# Patient Record
Sex: Female | Born: 1970 | Hispanic: No | Smoking: Current some day smoker
Health system: Southern US, Community
[De-identification: ages and names within clinical notes are randomized; demographics above are authoritative.]

## PROBLEM LIST (undated history)

## (undated) DIAGNOSIS — I10 Essential (primary) hypertension: Secondary | ICD-10-CM

## (undated) DIAGNOSIS — F329 Major depressive disorder, single episode, unspecified: Secondary | ICD-10-CM

## (undated) DIAGNOSIS — G4730 Sleep apnea, unspecified: Secondary | ICD-10-CM

## (undated) DIAGNOSIS — F419 Anxiety disorder, unspecified: Secondary | ICD-10-CM

## (undated) DIAGNOSIS — R569 Unspecified convulsions: Secondary | ICD-10-CM

## (undated) DIAGNOSIS — G709 Myoneural disorder, unspecified: Secondary | ICD-10-CM

## (undated) DIAGNOSIS — E119 Type 2 diabetes mellitus without complications: Secondary | ICD-10-CM

## (undated) DIAGNOSIS — J45909 Unspecified asthma, uncomplicated: Secondary | ICD-10-CM

## (undated) HISTORY — PX: EYE SURGERY: SHX253

## (undated) HISTORY — DX: Essential (primary) hypertension: I10

## (undated) HISTORY — DX: Anxiety disorder, unspecified: F41.9

## (undated) HISTORY — DX: Unspecified convulsions: R56.9

## (undated) HISTORY — PX: TUBAL LIGATION: SHX77

## (undated) HISTORY — DX: Sleep apnea, unspecified: G47.30

## (undated) HISTORY — DX: Myoneural disorder, unspecified: G70.9

---

## 1999-01-15 ENCOUNTER — Emergency Department (HOSPITAL_COMMUNITY): Admission: EM | Admit: 1999-01-15 | Discharge: 1999-01-16 | Payer: Self-pay | Admitting: Emergency Medicine

## 2007-10-09 ENCOUNTER — Emergency Department (HOSPITAL_COMMUNITY): Admission: EM | Admit: 2007-10-09 | Discharge: 2007-10-09 | Payer: Self-pay | Admitting: Emergency Medicine

## 2008-03-29 ENCOUNTER — Emergency Department (HOSPITAL_COMMUNITY): Admission: EM | Admit: 2008-03-29 | Discharge: 2008-03-30 | Payer: Self-pay | Admitting: Emergency Medicine

## 2011-01-20 ENCOUNTER — Inpatient Hospital Stay (HOSPITAL_COMMUNITY)
Admission: AD | Admit: 2011-01-20 | Discharge: 2011-01-20 | Disposition: A | Discharge status: Home / Self Care | Payer: Self-pay | Source: Ambulatory Visit | Attending: Family Medicine | Admitting: Family Medicine

## 2011-01-20 ENCOUNTER — Inpatient Hospital Stay (HOSPITAL_COMMUNITY): Payer: Self-pay

## 2011-01-20 DIAGNOSIS — N946 Dysmenorrhea, unspecified: Secondary | ICD-10-CM

## 2011-01-20 LAB — URINALYSIS, ROUTINE W REFLEX MICROSCOPIC
Bilirubin Urine: NEGATIVE
Ketones, ur: NEGATIVE mg/dL
Protein, ur: NEGATIVE mg/dL
Urobilinogen, UA: 0.2 mg/dL (ref 0.0–1.0)

## 2011-01-20 LAB — CBC
HCT: 39.3 % (ref 36.0–46.0)
Hemoglobin: 12.6 g/dL (ref 12.0–15.0)
MCV: 92.5 fL (ref 78.0–100.0)
Platelets: 274 10*3/uL (ref 150–400)
RBC: 4.25 MIL/uL (ref 3.87–5.11)
WBC: 7.8 10*3/uL (ref 4.0–10.5)

## 2011-01-20 LAB — URINE MICROSCOPIC-ADD ON: WBC, UA: NONE SEEN WBC/hpf (ref <3)

## 2011-01-20 LAB — POCT PREGNANCY, URINE: Preg Test, Ur: NEGATIVE

## 2011-02-18 ENCOUNTER — Encounter: Payer: Self-pay | Admitting: Obstetrics and Gynecology

## 2011-03-09 ENCOUNTER — Emergency Department (HOSPITAL_COMMUNITY)
Admission: EM | Admit: 2011-03-09 | Discharge: 2011-03-09 | Disposition: A | Discharge status: Home / Self Care | Payer: Self-pay | Attending: Emergency Medicine | Admitting: Emergency Medicine

## 2011-03-09 DIAGNOSIS — M549 Dorsalgia, unspecified: Secondary | ICD-10-CM | POA: Insufficient documentation

## 2011-03-09 DIAGNOSIS — X500XXA Overexertion from strenuous movement or load, initial encounter: Secondary | ICD-10-CM | POA: Insufficient documentation

## 2011-03-09 DIAGNOSIS — T148XXA Other injury of unspecified body region, initial encounter: Secondary | ICD-10-CM | POA: Insufficient documentation

## 2011-03-09 DIAGNOSIS — Y9269 Other specified industrial and construction area as the place of occurrence of the external cause: Secondary | ICD-10-CM | POA: Insufficient documentation

## 2011-03-09 DIAGNOSIS — F17200 Nicotine dependence, unspecified, uncomplicated: Secondary | ICD-10-CM | POA: Insufficient documentation

## 2011-03-09 DIAGNOSIS — M62838 Other muscle spasm: Secondary | ICD-10-CM | POA: Insufficient documentation

## 2011-03-09 DIAGNOSIS — Y990 Civilian activity done for income or pay: Secondary | ICD-10-CM | POA: Insufficient documentation

## 2011-07-29 LAB — POCT I-STAT, CHEM 8
Chloride: 103
Glucose, Bld: 103 — ABNORMAL HIGH
HCT: 47 — ABNORMAL HIGH
Potassium: 3.7

## 2012-04-24 ENCOUNTER — Encounter (HOSPITAL_COMMUNITY): Payer: Self-pay | Admitting: Emergency Medicine

## 2012-04-24 ENCOUNTER — Emergency Department (HOSPITAL_COMMUNITY): Payer: No Typology Code available for payment source

## 2012-04-24 ENCOUNTER — Emergency Department (HOSPITAL_COMMUNITY)
Admission: EM | Admit: 2012-04-24 | Discharge: 2012-04-24 | Disposition: A | Discharge status: Home / Self Care | Payer: No Typology Code available for payment source | Attending: Emergency Medicine | Admitting: Emergency Medicine

## 2012-04-24 DIAGNOSIS — M542 Cervicalgia: Secondary | ICD-10-CM | POA: Insufficient documentation

## 2012-04-24 MED ORDER — OXYCODONE-ACETAMINOPHEN 5-325 MG PO TABS: 1.0000 | ORAL_TABLET | Freq: Four times a day (QID) | ORAL | Status: AC | PRN

## 2012-04-24 MED ORDER — CYCLOBENZAPRINE HCL 10 MG PO TABS
5.0000 mg | ORAL_TABLET | Freq: Once | ORAL | Status: AC
  Administered 2012-04-24: 5 mg via ORAL
  Filled 2012-04-24: qty 1

## 2012-04-24 MED ORDER — OXYCODONE-ACETAMINOPHEN 5-325 MG PO TABS
1.0000 | ORAL_TABLET | Freq: Once | ORAL | Status: AC
  Administered 2012-04-24: 1 via ORAL
  Filled 2012-04-24: qty 1

## 2012-04-24 MED ORDER — CYCLOBENZAPRINE HCL 10 MG PO TABS
10.0000 mg | ORAL_TABLET | Freq: Two times a day (BID) | ORAL | Status: AC | PRN
Start: 2012-04-24 — End: 2012-05-04
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
## 2012-04-24 NOTE — ED Provider Notes (Signed)
Medical screening examination/treatment/procedure(s) were performed by non-physician practitioner and as supervising physician I was immediately available for consultation/collaboration.  Juliet Rude. Rubin Payor, MD 04/24/12 1415

## 2013-01-26 ENCOUNTER — Emergency Department (HOSPITAL_COMMUNITY): Payer: Self-pay

## 2013-01-26 ENCOUNTER — Encounter (HOSPITAL_COMMUNITY): Payer: Self-pay | Admitting: Emergency Medicine

## 2013-01-26 ENCOUNTER — Emergency Department (HOSPITAL_COMMUNITY)
Admission: EM | Admit: 2013-01-26 | Discharge: 2013-01-26 | Disposition: A | Discharge status: Home / Self Care | Payer: Self-pay | Attending: Emergency Medicine | Admitting: Emergency Medicine

## 2013-01-26 DIAGNOSIS — R0781 Pleurodynia: Secondary | ICD-10-CM

## 2013-01-26 DIAGNOSIS — F17200 Nicotine dependence, unspecified, uncomplicated: Secondary | ICD-10-CM | POA: Insufficient documentation

## 2013-01-26 DIAGNOSIS — R079 Chest pain, unspecified: Secondary | ICD-10-CM | POA: Insufficient documentation

## 2013-01-26 DIAGNOSIS — R059 Cough, unspecified: Secondary | ICD-10-CM | POA: Insufficient documentation

## 2013-01-26 DIAGNOSIS — R05 Cough: Secondary | ICD-10-CM | POA: Insufficient documentation

## 2013-01-26 MED ORDER — NAPROXEN 375 MG PO TABS: 375.0000 mg | ORAL_TABLET | Freq: Two times a day (BID) | ORAL | Status: DC

## 2013-01-26 MED ORDER — KETOROLAC TROMETHAMINE 60 MG/2ML IM SOLN
60.0000 mg | Freq: Once | INTRAMUSCULAR | Status: AC
  Administered 2013-01-26: 60 mg via INTRAMUSCULAR
  Filled 2013-01-26: qty 2

## 2013-01-26 MED ORDER — OXYCODONE-ACETAMINOPHEN 5-325 MG PO TABS
1.0000 | ORAL_TABLET | Freq: Once | ORAL | Status: AC
  Administered 2013-01-26: 1 via ORAL
  Filled 2013-01-26: qty 1

## 2013-01-26 NOTE — ED Provider Notes (Signed)
Medical screening examination/treatment/procedure(s) were performed by non-physician practitioner and as supervising physician I was immediately available for consultation/collaboration.   Richardean Canal, MD 01/26/13 [phone]

## 2013-01-26 NOTE — ED Notes (Signed)
Pt states little over week ago she started having chest pain under right breast.  States that is does ease with ibuprofen then flares back up, pain gets worse with coughing and movement.  Pt states that she has had cough and cold symptoms that started around the same time length she started having chest pain.

## 2013-01-26 NOTE — Progress Notes (Signed)
During [facility] ED 01/26/13 visit CM spoke with pt who confirms self pay [facility] resident with no pcp. CM discussed and provided written information for self pay pcps, importance of pcp for f/u care, www.needymeds.org, discounted pharmacies, MATCH program and other [place] resources such as financial assistance, DSS and  health department Reviewed Health connect number to assist with finding self pay provider close to pt's residence. Reviewed resources for [place] self pay pcps like [facility], family medicine at [address], [facility] family practice, general medical clinics, [facility] urgent care plus others, [facility] out patient [facility]ies, housing, affordable care act/health reform (deadline 01/30/13) and other resources in [employer]. Pt voiced understanding and appreciation of resources provided Has not signed up on affordable care act market place but states she will complete process

## 2013-01-26 NOTE — ED Provider Notes (Signed)
History     CSN: [id number]  Arrival date & time 01/26/13  1055   First MD Initiated Contact with Patient 01/26/13 1106      Chief Complaint  Patient presents with  . Chest Pain    under right breast    (Consider location/radiation/quality/duration/timing/severity/associated sxs/prior treatment) HPI Comments: Yolanda Hendricks is a 42 y.o. female with no significant past medical history presents emergency department complaining of right rib pain.  Onset of symptoms began a couple days ago and have been gradually worsening.  Patient states pain is worse with coughing and certain movements.  Pain unrelieved by over-the-counter ibuprofen.  Symptoms are moderate.  No known alleviating factors.  Severity rated at 6/10 and worsened with palpation.  Patient reports that she recently had upper respiratory infection that has since resolved and also has been working out a lot in the gym.  Patient believes right-sided rib pain to be musculoskeletal in nature, however it is not improving on its own so she is concerned and requests further evaluation.  Patient denies any current cough, fever, night sweats, chills, chest pain, shortness of breath, leg swelling, hemoptysis, recent travel, recent trauma.  No other complaints this time.  The history is provided by the patient.    History reviewed. No pertinent past medical history.  Past Surgical History  Procedure Laterality Date  . Eye surgery    . Tubal ligation      No family history on file.  History  Substance Use Topics  . Smoking status: Current Some Day Smoker -- 0.50 packs/day  . Smokeless tobacco: Never Used  . Alcohol Use: Yes     Comment: 2-3 40 oz cans    OB History   Grav Para Term Preterm Abortions TAB SAB Ect Mult Living                  Review of Systems  All other systems reviewed and are negative.    Allergies  Review of patient's allergies indicates no known allergies.  Home Medications   Current Outpatient Rx   Name  Route  Sig  Dispense  Refill  . ibuprofen (ADVIL,MOTRIN) 200 MG tablet   Oral   Take 400 mg by mouth every 6 (six) hours as needed for pain.           BP 127/83  Pulse 101  Temp(Src) 98.3 F (36.8 C) (Oral)  Resp 18  SpO2 98%  LMP 01/19/2013  Physical Exam  Nursing note and vitals reviewed. Constitutional: She is oriented to person, place, and time. She appears well-developed and well-nourished. No distress.  HENT:  Head: Normocephalic and atraumatic.  Eyes: Conjunctivae and EOM are normal.  Neck: Normal range of motion.  Cardiovascular:  Regular rate rhythm, intact distal pulses.  Pulmonary/Chest: Effort normal.  Lungs good auscultation bilaterally, normal breathing effort. ttp to right anterior ribs, no sternal ttp.   Musculoskeletal: Normal range of motion.  Neurological: She is alert and oriented to person, place, and time.  Skin: Skin is warm and dry. No rash noted. She is not diaphoretic.  Psychiatric: She has a normal mood and affect. Her behavior is normal.    ED Course  Procedures (including critical care time)  Labs Reviewed - No data to display Dg Ribs Unilateral W/chest Right  01/26/2013  *RADIOLOGY REPORT*  Clinical Data: 42 year old female right rib pain at the T7 level. Chest pain under right breast.  RIGHT RIBS AND CHEST - 3+ VIEW  Comparison: 03/29/2008.  Findings:  Lower lung volumes.  Cardiac size and mediastinal contours are within normal limits.  Visualized tracheal air column is within normal limits.  The lungs are clear.  No pneumothorax or effusion.  Oblique right rib views. Bone mineralization is within normal limits.  No displaced right rib fracture identified.  Stable mild thoracic scoliosis.  T7 vertebra appears unremarkable. Visualized bowel gas pattern is nonobstructed.  IMPRESSION: 1.  Lower lung volumes. No acute cardiopulmonary abnormality. 2.  No displaced right rib fracture.   Original Report Authenticated By: Erskine Speed, M.D.      Date: 01/26/2013  Rate: 85  Rhythm: normal sinus rhythm  QRS Axis: normal  Intervals: normal  ST/T Wave abnormalities: normal  Conduction Disutrbances: none  Narrative Interpretation:   Old EKG Reviewed: No significant changes noted     No diagnosis found.    MDM  Right rib pain   42 year old female presents emergency department with right-sided rib pain.  Pain managed in emergency Department, imaging reviewed without acute fracture seen.  Etiology likely musculoskeletal/inflammatory in nature.  Ecg and presentation with low concern for cardiopulmonary etiology. Will discharge with anti-inflammatories and work note for no heavy lifting x10 days.  Discussed plan with patient who is agreeable. At this time there does not appear to be any evidence of an acute emergency medical condition and the patient appears stable for discharge with appropriate outpatient follow up.Diagnosis was discussed with patient who verbalizes understanding and is agreeable to discharge.         Jaci Carrel, [place] 01/26/13 1223

## 2013-01-30 ENCOUNTER — Encounter (HOSPITAL_COMMUNITY): Payer: Self-pay

## 2013-01-30 ENCOUNTER — Emergency Department (HOSPITAL_COMMUNITY)
Admission: EM | Admit: 2013-01-30 | Discharge: 2013-01-30 | Disposition: A | Discharge status: Home / Self Care | Payer: Self-pay | Attending: Emergency Medicine | Admitting: Emergency Medicine

## 2013-01-30 DIAGNOSIS — F17200 Nicotine dependence, unspecified, uncomplicated: Secondary | ICD-10-CM | POA: Insufficient documentation

## 2013-01-30 DIAGNOSIS — Z09 Encounter for follow-up examination after completed treatment for conditions other than malignant neoplasm: Secondary | ICD-10-CM | POA: Insufficient documentation

## 2013-01-30 NOTE — ED Provider Notes (Signed)
Medical screening examination/treatment/procedure(s) were performed by non-physician practitioner and as supervising physician I was immediately available for consultation/collaboration.    Nelia Shi, MD 01/30/13 1524

## 2013-01-30 NOTE — ED Notes (Signed)
Pt escorted to discharge window. Verbalized understanding discharge instructions. In no acute distress.   

## 2013-01-30 NOTE — ED Notes (Signed)
Pt states that she was here on thurs and was on restrictions from work. Pt wants to be seen and another note to release her to go back to work.

## 2013-01-30 NOTE — ED Notes (Addendum)
Pt sts she was seen last Thursday for chest wall pain.  Sts she needs a note stating she is cleared to go back to work without restrictions.  Denies pain.

## 2013-01-30 NOTE — ED Provider Notes (Signed)
History     CSN: [id number]  Arrival date & time 01/30/13  1221   First MD Initiated Contact with Patient 01/30/13 1230      Chief Complaint  Patient presents with  . Follow-up    (Consider location/radiation/quality/duration/timing/severity/associated sxs/prior treatment) HPI Patient presents to the emergency department for a work release note.  Patient, states she was seen here last week for rib pain and is having no pain at this time.  Patient denies shortness of breath, fever, cough, dizziness, or syncope.  Patient, states, that the pain, improved following medications.  Patient, states, that she would like to go back to work at this time History reviewed. No pertinent past medical history.  Past Surgical History  Procedure Laterality Date  . Eye surgery    . Tubal ligation      History reviewed. No pertinent family history.  History  Substance Use Topics  . Smoking status: Current Some Day Smoker -- 0.50 packs/day  . Smokeless tobacco: Never Used  . Alcohol Use: Yes     Comment: 2-3 40 oz cans    OB History   Grav Para Term Preterm Abortions TAB SAB Ect Mult Living                  Review of Systems All other systems negative except as documented in the HPI. All pertinent positives and negatives as reviewed in the HPI. Allergies  Review of patient's allergies indicates no known allergies.  Home Medications   Current Outpatient Rx  Name  Route  Sig  Dispense  Refill  . ibuprofen (ADVIL,MOTRIN) 200 MG tablet   Oral   Take 400 mg by mouth every 6 (six) hours as needed for pain.         . naproxen (NAPROSYN) 375 MG tablet   Oral   Take 1 tablet (375 mg total) by mouth 2 (two) times daily.   20 tablet   0     BP 139/91  Pulse 93  Temp(Src) 98.4 F (36.9 C) (Oral)  Resp 16  SpO2 99%  LMP 01/19/2013  Physical Exam  Nursing note and vitals reviewed. Constitutional: She appears well-developed and well-nourished. No distress.  Cardiovascular:  Normal rate and regular rhythm.   Pulmonary/Chest: Effort normal and breath sounds normal. She exhibits no tenderness.    ED Course  Procedures (including critical care time)  Patient be given a work release note and told to return here as needed   MDM          Carlyle Dolly, PA-C 01/30/13 1313

## 2014-02-05 ENCOUNTER — Emergency Department (HOSPITAL_COMMUNITY)
Admission: EM | Admit: 2014-02-05 | Discharge: 2014-02-05 | Disposition: A | Discharge status: Home / Self Care | Payer: No Typology Code available for payment source | Attending: Emergency Medicine | Admitting: Emergency Medicine

## 2014-02-05 ENCOUNTER — Encounter (HOSPITAL_COMMUNITY): Payer: Self-pay | Admitting: Emergency Medicine

## 2014-02-05 DIAGNOSIS — F17200 Nicotine dependence, unspecified, uncomplicated: Secondary | ICD-10-CM | POA: Insufficient documentation

## 2014-02-05 DIAGNOSIS — R21 Rash and other nonspecific skin eruption: Secondary | ICD-10-CM | POA: Insufficient documentation

## 2014-02-05 MED ORDER — DIPHENHYDRAMINE HCL 25 MG PO TABS: 25.0000 mg | ORAL_TABLET | Freq: Four times a day (QID) | ORAL | Status: DC | PRN

## 2014-02-05 MED ORDER — DIPHENHYDRAMINE HCL 25 MG PO CAPS
25.0000 mg | ORAL_CAPSULE | Freq: Once | ORAL | Status: AC
  Administered 2014-02-05: 25 mg via ORAL
  Filled 2014-02-05: qty 1

## 2014-02-05 MED ORDER — HYDROCORTISONE 1 % EX LOTN: 1.0000 "application " | TOPICAL_LOTION | Freq: Two times a day (BID) | CUTANEOUS | Status: DC

## 2014-02-05 NOTE — Progress Notes (Signed)
[facility] provided pt with a list of primary care resources and a [facility] [employer] application to help patient establish primary care.  °

## 2014-02-05 NOTE — ED Provider Notes (Signed)
CSN: [id number]     Arrival date & time 02/05/14  1144 History  This chart was scribed for non-physician practitioner Jeannetta EllisJennifer L Krosby Ritchie, PA-C working with Richardean Canalavid H Yao, MD by Donne Anonayla Curran, ED Scribe. This patient was seen in room WTR5/WTR5 and the patient's care was started at 1200.   First MD Initiated Contact with Patient 02/05/14 1200     Chief Complaint  Patient presents with  . Rash    The history is provided by the patient. No language interpreter was used.   HPI Comments: Yolanda Hendricks is a [age] y.o. female who presents to the Emergency Department complaining of 2 days of gradual onset, gradually worsening, itching rash that began on her arm and has since spread to her entire body. She states that the rash began right after she donated plasma in the area where she was sticked, but she has been donating plasma for years. She denies any new exposures or changes to her daily routine. She has tried Lamisil cream and an ointment with no relief of symptoms. She denies fever, nausea, vomiting, trouble breathing, swelling in her face or throat or any other symptoms.   History reviewed. No pertinent past medical history. Past Surgical History  Procedure Laterality Date  . Eye surgery    . Tubal ligation     History reviewed. No pertinent family history. History  Substance Use Topics  . Smoking status: Current Some Day Smoker -- 0.50 packs/day  . Smokeless tobacco: Never Used  . Alcohol Use: Yes     Comment: 2-3 40 oz cans   OB History   Grav Para Term Preterm Abortions TAB SAB Ect Mult Living                 Review of Systems  Constitutional: Negative for fever.  HENT: Negative for facial swelling and trouble swallowing.   Respiratory: Negative for shortness of breath.   Cardiovascular: Negative for chest pain.  Gastrointestinal: Negative for nausea and vomiting.  Skin: Positive for rash.  All other systems reviewed and are negative.      Allergies  Review of  patient's allergies indicates no known allergies.  Home Medications   Current Outpatient Rx  Name  Route  Sig  Dispense  Refill  . diphenhydrAMINE (BENADRYL) 25 MG tablet   Oral   Take 1 tablet (25 mg total) by mouth every 6 (six) hours as needed for itching (Rash).   30 tablet   0   . hydrocortisone 1 % lotion   Topical   Apply 1 application topically 2 (two) times daily.   118 mL   0   . ibuprofen (ADVIL,MOTRIN) 200 MG tablet   Oral   Take 400 mg by mouth every 6 (six) hours as needed for pain.         . naproxen (NAPROSYN) 375 MG tablet   Oral   Take 1 tablet (375 mg total) by mouth 2 (two) times daily.   20 tablet   0    BP 118/84  Pulse 93  Temp(Src) 98.8 F (37.1 C) (Oral)  Resp 16  SpO2 100%  LMP 01/22/2014  Physical Exam  Nursing note and vitals reviewed. Constitutional: She is oriented to person, place, and time. She appears well-developed and well-nourished. No distress.  HENT:  Head: Normocephalic and atraumatic.  Right Ear: External ear normal.  Left Ear: External ear normal.  Nose: Nose normal.  Mouth/Throat: Oropharynx is clear and moist. No posterior oropharyngeal edema.  No  lip or tongue swelling  Eyes: Conjunctivae are normal.  Neck: Normal range of motion. Neck supple.  Cardiovascular: Normal rate, regular rhythm and normal heart sounds.   Pulmonary/Chest: Effort normal and breath sounds normal. No stridor. She has no wheezes.  Abdominal: Soft.  Musculoskeletal: Normal range of motion.  Neurological: She is alert and oriented to person, place, and time.  Skin: Skin is warm and dry. Rash noted. No petechiae noted. Rash is maculopapular. Rash is not vesicular. She is not diaphoretic.  Rash on arms, legs, stomach and back. No facial involvement. Nontender to palpation. No drainage.  Psychiatric: She has a normal mood and affect.    ED Course  Procedures (including critical care time) DIAGNOSTIC STUDIES: Oxygen Saturation is 100% on RA,  normal by my interpretation.    COORDINATION OF CARE: 12:21 PM Discussed treatment plan which includes 25 mg Benadryl in ED with pt at bedside and pt agreed to plan. Will discharge home with Benadryl and hydrocortisone 1% lotion. Return precautions advised. Advised pt to follow up with allergist, referral given.    Labs Review Labs Reviewed - No data to display Imaging Review No results found.   EKG Interpretation None      MDM   Final diagnoses:  Rash    Filed Vitals:   02/05/14 1159  BP: 118/84  Pulse: 93  Temp: 98.8 F (37.1 C)  Resp: 16   Afebrile, NAD, non-toxic appearing, AAOx4. Rash consistent with allergic reaction. No evidence of infection. Patient re-evaluated prior to dc, is hemodynamically stable, in no respiratory distress, and denies the feeling of throat closing. Pt has been advised to take OTC benadryl & return to the ED if they have a mod-severe allergic rxn (s/s including throat closing, difficulty breathing, swelling of lips face or tongue). Pt is to follow up with their PCP. Pt is agreeable with plan & verbalizes understanding.    I personally performed the services described in this documentation, which was scribed in my presence. The recorded information has been reviewed and is accurate.     Jeannetta Ellis, PA-C 02/05/14 2024

## 2014-02-05 NOTE — ED Notes (Signed)
Patient states that she gave plasma 2 days ago and she was itching afterwards. She now has a full body rash. Patient is without any changes to detergents or medications

## 2014-02-05 NOTE — Discharge Instructions (Signed)
Please follow up with your primary care physician in 1-2 days. If you do not have one please call the [facility] number listed above. Please follow up with allergist to schedule a follow up appointment. Please take Benadryl as prescribed. Please read all discharge instructions and return precautions.     Allergies Allergies may happen from anything your body is sensitive to. This may be food, medicines, pollens, chemicals, and nearly anything around you in everyday life that produces allergens. An allergen is anything that causes an allergy producing substance. Heredity is often a factor in causing these problems. This means you may have some of the same allergies as your parents. Food allergies happen in all age groups. Food allergies are some of the most severe and life threatening. Some common food allergies are cow's milk, seafood, eggs, nuts, wheat, and soybeans. SYMPTOMS   Swelling around the mouth.  An itchy red rash or hives.  Vomiting or diarrhea.  Difficulty breathing. SEVERE ALLERGIC REACTIONS ARE LIFE-THREATENING. This reaction is called anaphylaxis. It can cause the mouth and throat to swell and cause difficulty with breathing and swallowing. In severe reactions only a trace amount of food (for example, peanut oil in a salad) may cause death within seconds. Seasonal allergies occur in all age groups. These are seasonal because they usually occur during the same season every year. They may be a reaction to molds, grass pollens, or tree pollens. Other causes of problems are house dust mite allergens, pet dander, and mold spores. The symptoms often consist of nasal congestion, a runny itchy nose associated with sneezing, and tearing itchy eyes. There is often an associated itching of the mouth and ears. The problems happen when you come in contact with pollens and other allergens. Allergens are the particles in the air that the body reacts to with an allergic  reaction. This causes you to release allergic antibodies. Through a chain of events, these eventually cause you to release histamine into the blood stream. Although it is meant to be protective to the body, it is this release that causes your discomfort. This is why you were given anti-histamines to feel better. If you are unable to pinpoint the offending allergen, it may be determined by skin or blood testing. Allergies cannot be cured but can be controlled with medicine. Hay fever is a collection of all or some of the seasonal allergy problems. It may often be treated with simple over-the-counter medicine such as diphenhydramine. Take medicine as directed. Do not drink alcohol or drive while taking this medicine. Check with your caregiver or package insert for child dosages. If these medicines are not effective, there are many new medicines your caregiver can prescribe. Stronger medicine such as nasal spray, eye drops, and corticosteroids may be used if the first things you try do not work well. Other treatments such as immunotherapy or desensitizing injections can be used if all else fails. Follow up with your caregiver if problems continue. These seasonal allergies are usually not life threatening. They are generally more of a nuisance that can often be handled using medicine. HOME CARE INSTRUCTIONS   If unsure what causes a reaction, keep a diary of foods eaten and symptoms that follow. Avoid foods that cause reactions.  If hives or rash are present:  Take medicine as directed.  You may use an over-the-counter antihistamine (diphenhydramine) for hives and itching as needed.  Apply cold compresses (cloths) to the skin or take baths in cool water. Avoid  hot baths or showers. Heat will make a rash and itching worse.  If you are severely allergic:  Following a treatment for a severe reaction, hospitalization is often required for closer follow-up.  Wear a medic-alert bracelet or necklace stating  the allergy.  You and your family must learn how to give adrenaline or use an anaphylaxis kit.  If you have had a severe reaction, always carry your anaphylaxis kit or EpiPen with you. Use this medicine as directed by your caregiver if a severe reaction is occurring. Failure to do so could have a fatal outcome. SEEK MEDICAL CARE IF:  You suspect a food allergy. Symptoms generally happen within 30 minutes of eating a food.  Your symptoms have not gone away within 2 days or are getting worse.  You develop new symptoms.  You want to retest yourself or your child with a food or drink you think causes an allergic reaction. Never do this if an anaphylactic reaction to that food or drink has happened before. Only do this under the care of a caregiver. SEEK IMMEDIATE MEDICAL CARE IF:   You have difficulty breathing, are wheezing, or have a tight feeling in your chest or throat.  You have a swollen mouth, or you have hives, swelling, or itching all over your body.  You have had a severe reaction that has responded to your anaphylaxis kit or an EpiPen. These reactions may return when the medicine has worn off. These reactions should be considered life threatening. MAKE SURE YOU:   Understand these instructions.  Will watch your condition.  Will get help right away if you are not doing well or get worse. Document Released: 01/12/2003 Document Revised: 02/13/2013 Document Reviewed: 06/18/2008 [facility] Patient Information 2014 [place]. Rash A rash is a change in the color or texture of your skin. There are many different types of rashes. You may have other problems that accompany your rash. CAUSES   Infections.  Allergic reactions. This can include allergies to pets or foods.  Certain medicines.  Exposure to certain chemicals, soaps, or cosmetics.  Heat.  Exposure to poisonous plants.  Tumors, both cancerous and noncancerous. SYMPTOMS   Redness.  Scaly skin.  Itchy  skin.  Dry or cracked skin.  Bumps.  Blisters.  Pain. DIAGNOSIS  Your caregiver may do a physical exam to determine what type of rash you have. A skin sample (biopsy) may be taken and examined under a microscope. TREATMENT  Treatment depends on the type of rash you have. Your caregiver may prescribe certain medicines. For serious conditions, you may need to see a skin doctor (dermatologist). HOME CARE INSTRUCTIONS   Avoid the substance that caused your rash.  Do not scratch your rash. This can cause infection.  You may take cool baths to help stop itching.  Only take over-the-counter or prescription medicines as directed by your caregiver.  Keep all follow-up appointments as directed by your caregiver. SEEK IMMEDIATE MEDICAL CARE IF:  You have increasing pain, swelling, or redness.  You have a fever.  You have new or severe symptoms.  You have body aches, diarrhea, or vomiting.  Your rash is not better after 3 days. MAKE SURE YOU:  Understand these instructions.  Will watch your condition.  Will get help right away if you are not doing well or get worse. Document Released: 10/09/2002 Document Revised: 01/11/2012 Document Reviewed: 08/03/2011 [facility] Patient Information 2014 [place], [place].

## 2014-02-08 NOTE — ED Provider Notes (Signed)
Medical screening examination/treatment/procedure(s) were performed by non-physician practitioner and as supervising physician I was immediately available for consultation/collaboration.   EKG Interpretation None        Richardean Canalavid H Emerson Barretto, MD 02/08/14 [phone]

## 2014-11-21 ENCOUNTER — Encounter (HOSPITAL_COMMUNITY): Payer: Self-pay | Admitting: Emergency Medicine

## 2014-11-21 ENCOUNTER — Emergency Department (HOSPITAL_COMMUNITY)
Admission: EM | Admit: 2014-11-21 | Discharge: 2014-11-21 | Disposition: A | Discharge status: Home / Self Care | Payer: Self-pay | Attending: Emergency Medicine | Admitting: Emergency Medicine

## 2014-11-21 ENCOUNTER — Emergency Department (HOSPITAL_COMMUNITY): Payer: Self-pay

## 2014-11-21 DIAGNOSIS — J40 Bronchitis, not specified as acute or chronic: Secondary | ICD-10-CM | POA: Insufficient documentation

## 2014-11-21 DIAGNOSIS — Z791 Long term (current) use of non-steroidal anti-inflammatories (NSAID): Secondary | ICD-10-CM | POA: Insufficient documentation

## 2014-11-21 DIAGNOSIS — Z720 Tobacco use: Secondary | ICD-10-CM | POA: Insufficient documentation

## 2014-11-21 DIAGNOSIS — R059 Cough, unspecified: Secondary | ICD-10-CM

## 2014-11-21 DIAGNOSIS — Z7952 Long term (current) use of systemic steroids: Secondary | ICD-10-CM | POA: Insufficient documentation

## 2014-11-21 DIAGNOSIS — R05 Cough: Secondary | ICD-10-CM

## 2014-11-21 MED ORDER — BENZONATATE 100 MG PO CAPS: 100.0000 mg | ORAL_CAPSULE | Freq: Three times a day (TID) | ORAL | Status: DC

## 2014-11-21 MED ORDER — PREDNISONE 20 MG PO TABS
60.0000 mg | ORAL_TABLET | Freq: Once | ORAL | Status: AC
  Administered 2014-11-21: 60 mg via ORAL
  Filled 2014-11-21: qty 3

## 2014-11-21 MED ORDER — ALBUTEROL SULFATE (2.5 MG/3ML) 0.083% IN NEBU
5.0000 mg | INHALATION_SOLUTION | Freq: Once | RESPIRATORY_TRACT | Status: AC
  Administered 2014-11-21: 5 mg via RESPIRATORY_TRACT
  Filled 2014-11-21: qty 6

## 2014-11-21 MED ORDER — ALBUTEROL SULFATE HFA 108 (90 BASE) MCG/ACT IN AERS
2.0000 | INHALATION_SPRAY | Freq: Once | RESPIRATORY_TRACT | Status: AC
  Administered 2014-11-21: 2 via RESPIRATORY_TRACT
  Filled 2014-11-21: qty 6.7

## 2014-11-21 MED ORDER — PREDNISONE 10 MG PO TABS: ORAL_TABLET | ORAL | Status: DC

## 2014-11-21 MED ORDER — HYDROCOD POLST-CHLORPHEN POLST 10-8 MG/5ML PO LQCR
5.0000 mL | Freq: Once | ORAL | Status: AC
  Administered 2014-11-21: 5 mL via ORAL
  Filled 2014-11-21: qty 5

## 2014-11-21 NOTE — Discharge Instructions (Signed)
Prednisone until all gone. Inhaler 2 puffs every 4 hrs. Tessalon for cough. Follow up with primary care doctor for recheck. Return if worsening.   Acute Bronchitis Bronchitis is inflammation of the airways that extend from the windpipe into the lungs (bronchi). The inflammation often causes mucus to develop. This leads to a cough, which is the most common symptom of bronchitis.  In acute bronchitis, the condition usually develops suddenly and goes away over time, usually in a couple weeks. Smoking, allergies, and asthma can make bronchitis worse. Repeated episodes of bronchitis may cause further lung problems.  CAUSES Acute bronchitis is most often caused by the same virus that causes a cold. The virus can spread from person to person (contagious) through coughing, sneezing, and touching contaminated objects. SIGNS AND SYMPTOMS   Cough.   Fever.   Coughing up mucus.   Body aches.   Chest congestion.   Chills.   Shortness of breath.   Sore throat.  DIAGNOSIS  Acute bronchitis is usually diagnosed through a physical exam. Your health care provider will also ask you questions about your medical history. Tests, such as chest X-rays, are sometimes done to rule out other conditions.  TREATMENT  Acute bronchitis usually goes away in a couple weeks. Oftentimes, no medical treatment is necessary. Medicines are sometimes given for relief of fever or cough. Antibiotic medicines are usually not needed but may be prescribed in certain situations. In some cases, an inhaler may be recommended to help reduce shortness of breath and control the cough. A cool mist vaporizer may also be used to help thin bronchial secretions and make it easier to clear the chest.  HOME CARE INSTRUCTIONS  Get plenty of rest.   Drink enough fluids to keep your urine clear or pale yellow (unless you have a medical condition that requires fluid restriction). Increasing fluids may help thin your respiratory  secretions (sputum) and reduce chest congestion, and it will prevent dehydration.   Take medicines only as directed by your health care provider.  If you were prescribed an antibiotic medicine, finish it all even if you start to feel better.  Avoid smoking and secondhand smoke. Exposure to cigarette smoke or irritating chemicals will make bronchitis worse. If you are a smoker, consider using nicotine gum or skin patches to help control withdrawal symptoms. Quitting smoking will help your lungs heal faster.   Reduce the chances of another bout of acute bronchitis by washing your hands frequently, avoiding people with cold symptoms, and trying not to touch your hands to your mouth, nose, or eyes.   Keep all follow-up visits as directed by your health care provider.  SEEK MEDICAL CARE IF: Your symptoms do not improve after 1 week of treatment.  SEEK IMMEDIATE MEDICAL CARE IF:  You develop an increased fever or chills.   You have chest pain.   You have severe shortness of breath.  You have bloody sputum.   You develop dehydration.  You faint or repeatedly feel like you are going to pass out.  You develop repeated vomiting.  You develop a severe headache. MAKE SURE YOU:   Understand these instructions.  Will watch your condition.  Will get help right away if you are not doing well or get worse. Document Released: 11/26/2004 Document Revised: 03/05/2014 Document Reviewed: 04/11/2013 [facility] Patient Information 2015 [place]. This information is not intended to replace advice given to you by your health care provider. Make sure you discuss any questions you have with your health  care provider.  

## 2014-11-21 NOTE — ED Provider Notes (Signed)
CSN: [id number]     Arrival date & time 11/21/14  1429 History  This chart was scribed for non-physician practitioner working with Geoffery Lyonsouglas Delo, MD by Elveria Risingimelie Horne, ED Scribe. This patient was seen in room WTR6/WTR6 and the patient's care was started at 3:00 PM.   Chief Complaint  Patient presents with  . Cough   The history is provided by the patient. No language interpreter was used.   HPI Comments: Yolanda Hendricks is a 44 y.o. female who presents to the Emergency Department complaining of nonproductive, persistent cough for two days. Patient reports that her coughing is so violent that she feels burning in her chest. Patient reports treatment with ibuprofen, but denies relief. Patient is an admitted smoker.   History reviewed. No pertinent past medical history. Past Surgical History  Procedure Laterality Date  . Eye surgery    . Tubal ligation     No family history on file. History  Substance Use Topics  . Smoking status: Current Some Day Smoker -- 0.50 packs/day  . Smokeless tobacco: Never Used  . Alcohol Use: Yes     Comment: 2-3 40 oz cans   OB History    No data available     Review of Systems  Constitutional: Negative for fever and chills.  HENT: Positive for congestion and rhinorrhea. Negative for sore throat.   Respiratory: Positive for cough.       Allergies  Review of patient's allergies indicates no known allergies.  Home Medications   Prior to Admission medications   Medication Sig Start Date End Date Taking? Authorizing Provider  ibuprofen (ADVIL,MOTRIN) 200 MG tablet Take 400 mg by mouth every 6 (six) hours as needed for pain.   Yes Historical Provider, MD  diphenhydrAMINE (BENADRYL) 25 MG tablet Take 1 tablet (25 mg total) by mouth every 6 (six) hours as needed for itching (Rash). Patient not taking: Reported on 11/21/2014 02/05/14   Victorino DikeJennifer L Piepenbrink, PA-C  hydrocortisone 1 % lotion Apply 1 application topically 2 (two) times daily. Patient not  taking: Reported on 11/21/2014 02/05/14   Victorino DikeJennifer L Piepenbrink, PA-C  naproxen (NAPROSYN) 375 MG tablet Take 1 tablet (375 mg total) by mouth 2 (two) times daily. Patient not taking: Reported on 11/21/2014 01/26/13   Jaci CarrelLisette Paz, PA-C   Triage Vitals: Pulse 93  Temp(Src) 98 F (36.7 C) (Oral)  SpO2 100% Physical Exam  Constitutional: She is oriented to person, place, and time. She appears well-developed and well-nourished. No distress.  HENT:  Head: Normocephalic and atraumatic.  Right Ear: Tympanic membrane and external ear normal.  Left Ear: Tympanic membrane and external ear normal.  Mouth/Throat: Oropharynx is clear and moist.  Eyes: EOM are normal.  Neck: Neck supple. No tracheal deviation present.  Cardiovascular: Normal rate.   Pulmonary/Chest: Effort normal. No respiratory distress. She has no wheezes.  Mild expiratory wheezes bilaterally. Pt is coughing  Musculoskeletal: Normal range of motion.  Neurological: She is alert and oriented to person, place, and time.  Skin: Skin is warm and dry.  Psychiatric: She has a normal mood and affect. Her behavior is normal.  Nursing note and vitals reviewed.   ED Course  Procedures (including critical care time)  COORDINATION OF CARE: 3:00 PM- Discussed treatment plan with patient at bedside and patient agreed to plan.   Labs Review Labs Reviewed - No data to display  Imaging Review Dg Chest 2 View  11/21/2014   CLINICAL DATA:  Two day history of productive cough.  EXAM: CHEST  2 VIEW  COMPARISON:  01/26/2013  FINDINGS: The cardiac silhouette, mediastinal and hilar contours are within normal limits and stable. Mild bronchitic changes likely related to smoking. No infiltrates or effusions. The bony thorax is intact.  IMPRESSION: Mild bronchitic changes likely related to smoking. No focal infiltrates.   Electronically Signed   By: Loralie Champagne M.D.   On: 11/21/2014 15:35     EKG Interpretation None      MDM   Final  diagnoses:  Cough  Bronchitis    Pt with cough for two days, pain in chest with coughing. No risk factors for cardiac disease. PERC negative. CXR with bronchitic changes. Pt received neb treatment in ED. Prednisone. Tussionex. Stable for d/c home, tx with prednisone, tessalon, inhaler. Follow up with primary care doctor.   Filed Vitals:   11/21/14 1433  BP: 106/82  Pulse: 93  Temp: 98 F (36.7 C)  TempSrc: Oral  Resp: 20  SpO2: 100%     I personally performed the services described in this documentation, which was scribed in my presence. The recorded information has been reviewed and is accurate.    Lottie Mussel, PA-C 11/21/14 1601  Gwyneth Sprout, MD 11/21/14 1710

## 2014-11-21 NOTE — ED Notes (Signed)
Pt c/o nonproductive cough x 2 days with burning to chest when she coughs.

## 2015-02-22 ENCOUNTER — Encounter (HOSPITAL_COMMUNITY): Payer: Self-pay | Admitting: Emergency Medicine

## 2015-02-22 ENCOUNTER — Emergency Department (HOSPITAL_COMMUNITY)
Admission: EM | Admit: 2015-02-22 | Discharge: 2015-02-22 | Disposition: A | Discharge status: Home / Self Care | Payer: Self-pay | Attending: Emergency Medicine | Admitting: Emergency Medicine

## 2015-02-22 DIAGNOSIS — J209 Acute bronchitis, unspecified: Secondary | ICD-10-CM | POA: Insufficient documentation

## 2015-02-22 DIAGNOSIS — J40 Bronchitis, not specified as acute or chronic: Secondary | ICD-10-CM

## 2015-02-22 DIAGNOSIS — Z791 Long term (current) use of non-steroidal anti-inflammatories (NSAID): Secondary | ICD-10-CM | POA: Insufficient documentation

## 2015-02-22 DIAGNOSIS — R110 Nausea: Secondary | ICD-10-CM | POA: Insufficient documentation

## 2015-02-22 DIAGNOSIS — Z7952 Long term (current) use of systemic steroids: Secondary | ICD-10-CM | POA: Insufficient documentation

## 2015-02-22 DIAGNOSIS — Z720 Tobacco use: Secondary | ICD-10-CM | POA: Insufficient documentation

## 2015-02-22 MED ORDER — ALBUTEROL SULFATE HFA 108 (90 BASE) MCG/ACT IN AERS
2.0000 | INHALATION_SPRAY | Freq: Once | RESPIRATORY_TRACT | Status: AC
  Administered 2015-02-22: 2 via RESPIRATORY_TRACT
  Filled 2015-02-22: qty 6.7

## 2015-02-22 MED ORDER — AZITHROMYCIN 250 MG PO TABS: 250.0000 mg | ORAL_TABLET | Freq: Every day | ORAL | Status: DC

## 2015-02-22 MED ORDER — HYDROCOD POLST-CPM POLST ER 10-8 MG/5ML PO SUER: 5.0000 mL | Freq: Two times a day (BID) | ORAL | Status: DC | PRN

## 2015-02-22 NOTE — Discharge Instructions (Signed)
Read the information below.  Use the prescribed medication as directed.  Please discuss all new medications with your pharmacist.  You may return to the Emergency Department at any time for worsening condition or any new symptoms that concern you.  If there is any possibility that you might be pregnant, please let your health care provider know and discuss this with the pharmacist to ensure medication safety.    If you develop worsening shortness of breath, uncontrolled wheezing, severe chest pain, or fevers despite using tylenol and/or ibuprofen, return for a recheck.     °

## 2015-02-22 NOTE — ED Notes (Signed)
Pt c/o cough x2 weeks with nasal congestion/drainage (clear phlegm/sputum). Says, "my chest feels tight when I cough and I get SOB when I cough." Denies N/V/D/F. Says, "I just haven't been feeling good." No other c/c. RR even/unlabored. Pt still smokes (15 cigarettes per day). Denies hx asthma.

## 2015-02-22 NOTE — ED Provider Notes (Signed)
CSN: [id number]     Arrival date & time 02/22/15  1458 History   First MD Initiated Contact with Patient 02/22/15 1528     Chief Complaint  Patient presents with  . Cough  . Nasal Congestion   Patient is a 44 y.o. female presenting with cough. The history is provided by the patient. No language interpreter was used.  Cough Associated symptoms: rhinorrhea and shortness of breath   Associated symptoms: no chills, no fever, no myalgias, no rash and no sore throat    This chart was scribed for non-physician practitioner Trixie Dredge, PA-C, working with Rolan Bucco, MD, by Andrew Au, ED Scribe. This patient was seen in room WTR6/WTR6 and the patient's care was started at 3:28 PM.  Yolanda Hendricks is a 44 y.o. female who presents to the Emergency Department complaining of mild productive cough consisting of mucous and nasal congestion. Pt reports she has been sick with cough and congestion for the past month but developed worsening symptoms last night of chest tightness, SOB, rhinorrhea and occasional nausea. Pt denies taking anything for symptoms. Pt is smoker. She denies hx of asthma and allergies. Pt recent immobilization. She denies family hx of blood clots.  Denies exogenous estrogen.  Pt denies fever, chills sore throat, sneezing, abdominal pain, emesis, leg swelling and body aches.   History reviewed. No pertinent past medical history. Past Surgical History  Procedure Laterality Date  . Eye surgery    . Tubal ligation     History reviewed. No pertinent family history. History  Substance Use Topics  . Smoking status: Current Some Day Smoker -- 1.00 packs/day    Types: Cigarettes  . Smokeless tobacco: Never Used  . Alcohol Use: Yes     Comment: 2-3 40 oz cans   OB History    No data available     Review of Systems  Constitutional: Negative for fever and chills.  HENT: Positive for congestion and rhinorrhea. Negative for sneezing and sore throat.   Respiratory: Positive for  cough, chest tightness and shortness of breath.   Cardiovascular: Negative for leg swelling.  Gastrointestinal: Positive for nausea. Negative for vomiting and abdominal pain.  Genitourinary: Negative for difficulty urinating and menstrual problem.  Musculoskeletal: Negative for myalgias.  Skin: Negative for rash.  Allergic/Immunologic: Negative for immunocompromised state.  Hematological: Does not bruise/bleed easily.    Allergies  Review of patient's allergies indicates no known allergies.  Home Medications   Prior to Admission medications   Medication Sig Start Date End Date Taking? Authorizing Provider  benzonatate (TESSALON) 100 MG capsule Take 1 capsule (100 mg total) by mouth every 8 (eight) hours. 11/21/14   Tatyana Kirichenko, PA-C  diphenhydrAMINE (BENADRYL) 25 MG tablet Take 1 tablet (25 mg total) by mouth every 6 (six) hours as needed for itching (Rash). Patient not taking: Reported on 11/21/2014 02/05/14   Francee Piccolo, PA-C  hydrocortisone 1 % lotion Apply 1 application topically 2 (two) times daily. Patient not taking: Reported on 11/21/2014 02/05/14   Francee Piccolo, PA-C  ibuprofen (ADVIL,MOTRIN) 200 MG tablet Take 400 mg by mouth every 6 (six) hours as needed for pain.    Historical Provider, MD  naproxen (NAPROSYN) 375 MG tablet Take 1 tablet (375 mg total) by mouth 2 (two) times daily. Patient not taking: Reported on 11/21/2014 01/26/13   Lisette Paz, PA-C  predniSONE (DELTASONE) 10 MG tablet Take 5 tab day 1, take 4 tab day 2, take 3 tab day 3, take 2 tab day  4, and take 1 tab day 5 11/21/14   Tatyana Kirichenko, PA-C   BP 126/83 mmHg  Pulse 106  Temp(Src) 98.2 F (36.8 C) (Oral)  Resp 20  SpO2 100%  LMP 02/11/2015 (Approximate) Physical Exam  Constitutional: She appears well-developed and well-nourished. No distress.  HENT:  Head: Normocephalic and atraumatic.  Nose: Right sinus exhibits no maxillary sinus tenderness and no frontal sinus tenderness. Left  sinus exhibits no maxillary sinus tenderness and no frontal sinus tenderness.  Mouth/Throat: Oropharynx is clear and moist. No oropharyngeal exudate.  Nasal discharge  Eyes: Conjunctivae are normal.  Neck: Neck supple.  Cardiovascular: Normal rate and regular rhythm.   Pulmonary/Chest: Effort normal and breath sounds normal. No respiratory distress. She has no wheezes. She has no rales.  Abdominal: Soft. She exhibits no distension. There is no tenderness. There is no rebound and no guarding.  Musculoskeletal: She exhibits no edema.  Lymphadenopathy:    She has no cervical adenopathy.  Neurological: She is alert.  Skin: She is not diaphoretic.  Nursing note and vitals reviewed.   ED Course  Procedures (including critical care time) DIAGNOSTIC STUDIES: Oxygen Saturation is 100% on RA, normal by my interpretation.    COORDINATION OF CARE: 3:54 PM- Pt advised of plan for treatment and pt agrees.   Labs Review Labs Reviewed - No data to display  Imaging Review No results found.   EKG Interpretation None      MDM   Final diagnoses:  Bronchitis   Afebrile, nontoxic patient with constellation of symptoms suggestive of viral syndrome with worsening after several weeks, starting yesterday.  Pt is a smoker.  No concerning findings on exam. No known risk factors for PE.  Given duration or symptoms with acute worsening, will treat with antibiotics. Discharged home with Z-pak and supportive care, PCP follow up.  Discussed result, findings, treatment, and follow up  with patient.  Pt given return precautions.  Pt verbalizes understanding and agrees with plan.       I personally performed the services described in this documentation, which was scribed in my presence. The recorded information has been reviewed and is accurate.    Trixie Dredgemily Johathan Province, PA-C 02/22/15 1623  Rolan BuccoMelanie Belfi, MD 02/27/15 [phone]

## 2015-04-19 ENCOUNTER — Emergency Department (HOSPITAL_COMMUNITY)
Admission: EM | Admit: 2015-04-19 | Discharge: 2015-04-19 | Disposition: A | Discharge status: Home / Self Care | Payer: Self-pay | Attending: Emergency Medicine | Admitting: Emergency Medicine

## 2015-04-19 ENCOUNTER — Emergency Department (HOSPITAL_COMMUNITY): Payer: Self-pay

## 2015-04-19 ENCOUNTER — Encounter (HOSPITAL_COMMUNITY): Payer: Self-pay | Admitting: General Practice

## 2015-04-19 DIAGNOSIS — Z7951 Long term (current) use of inhaled steroids: Secondary | ICD-10-CM | POA: Insufficient documentation

## 2015-04-19 DIAGNOSIS — J209 Acute bronchitis, unspecified: Secondary | ICD-10-CM

## 2015-04-19 DIAGNOSIS — J42 Unspecified chronic bronchitis: Secondary | ICD-10-CM | POA: Insufficient documentation

## 2015-04-19 DIAGNOSIS — Z720 Tobacco use: Secondary | ICD-10-CM | POA: Insufficient documentation

## 2015-04-19 LAB — CBC WITH DIFFERENTIAL/PLATELET
BASOS PCT: 1 % (ref 0–1)
Basophils Absolute: 0.1 10*3/uL (ref 0.0–0.1)
Eosinophils Absolute: 0.7 10*3/uL (ref 0.0–0.7)
Eosinophils Relative: 6 % — ABNORMAL HIGH (ref 0–5)
HEMATOCRIT: 42.7 % (ref 36.0–46.0)
Hemoglobin: 14.4 g/dL (ref 12.0–15.0)
Lymphocytes Relative: 22 % (ref 12–46)
Lymphs Abs: 2.9 10*3/uL (ref 0.7–4.0)
MCH: 31.2 pg (ref 26.0–34.0)
MCHC: 33.7 g/dL (ref 30.0–36.0)
MCV: 92.6 fL (ref 78.0–100.0)
MONO ABS: 1.1 10*3/uL — AB (ref 0.1–1.0)
Monocytes Relative: 8 % (ref 3–12)
Neutro Abs: 8.5 10*3/uL — ABNORMAL HIGH (ref 1.7–7.7)
Neutrophils Relative %: 63 % (ref 43–77)
Platelets: 236 10*3/uL (ref 150–400)
RBC: 4.61 MIL/uL (ref 3.87–5.11)
RDW: 13.1 % (ref 11.5–15.5)
WBC: 13.4 10*3/uL — ABNORMAL HIGH (ref 4.0–10.5)

## 2015-04-19 LAB — BASIC METABOLIC PANEL
Anion gap: 7 (ref 5–15)
BUN: 10 mg/dL (ref 6–20)
CHLORIDE: 107 mmol/L (ref 101–111)
CO2: 24 mmol/L (ref 22–32)
Calcium: 8.8 mg/dL — ABNORMAL LOW (ref 8.9–10.3)
Creatinine, Ser: 0.82 mg/dL (ref 0.44–1.00)
GFR calc Af Amer: >60 mL/min (ref >60)
GFR calc non Af Amer: >60 mL/min (ref >60)
GLUCOSE: 121 mg/dL — AB (ref 65–99)
Potassium: 4.8 mmol/L (ref 3.5–5.1)
Sodium: 138 mmol/L (ref 135–145)

## 2015-04-19 MED ORDER — PREDNISONE 20 MG PO TABS: 40.0000 mg | ORAL_TABLET | Freq: Every day | ORAL | Status: DC

## 2015-04-19 MED ORDER — ALBUTEROL SULFATE HFA 108 (90 BASE) MCG/ACT IN AERS: 1.0000 | INHALATION_SPRAY | RESPIRATORY_TRACT | Status: DC | PRN

## 2015-04-19 MED ORDER — ALBUTEROL SULFATE (2.5 MG/3ML) 0.083% IN NEBU
5.0000 mg | INHALATION_SOLUTION | Freq: Once | RESPIRATORY_TRACT | Status: AC
  Administered 2015-04-19: 5 mg via RESPIRATORY_TRACT
  Filled 2015-04-19: qty 6

## 2015-04-19 MED ORDER — IPRATROPIUM BROMIDE 0.02 % IN SOLN
0.5000 mg | Freq: Once | RESPIRATORY_TRACT | Status: AC
  Administered 2015-04-19: 0.5 mg via RESPIRATORY_TRACT
  Filled 2015-04-19: qty 2.5

## 2015-04-19 MED ORDER — FLUTICASONE PROPIONATE HFA 110 MCG/ACT IN AERO: 1.0000 | INHALATION_SPRAY | Freq: Two times a day (BID) | RESPIRATORY_TRACT | Status: DC

## 2015-04-19 NOTE — Discharge Instructions (Signed)
Chronic Obstructive Pulmonary Disease °Chronic obstructive pulmonary disease (COPD) is a common lung condition in which airflow from the lungs is limited. COPD is a general term that can be used to describe many different lung problems that limit airflow, including both chronic bronchitis and emphysema.  If you have COPD, your lung function will probably never return to normal, but there are measures you can take to improve lung function and make yourself feel better.  °CAUSES  °· Smoking (common).   °· Exposure to secondhand smoke.   °· Genetic problems. °· Chronic inflammatory lung diseases or recurrent infections. °SYMPTOMS  °· Shortness of breath, especially with physical activity.   °· Deep, persistent (chronic) cough with a large amount of thick mucus.   °· Wheezing.   °· Rapid breaths (tachypnea).   °· Gray or bluish discoloration (cyanosis) of the skin, especially in fingers, toes, or lips.   °· Fatigue.   °· Weight loss.   °· Frequent infections or episodes when breathing symptoms become much worse (exacerbations).   °· Chest tightness. °DIAGNOSIS  °Your health care provider will take a medical history and perform a physical examination to make the initial diagnosis.  Additional tests for COPD may include:  °· Lung (pulmonary) function tests. °· Chest X-ray. °· CT scan. °· Blood tests. °TREATMENT  °Treatment available to help you feel better when you have COPD includes:  °· Inhaler and nebulizer medicines. These help manage the symptoms of COPD and make your breathing more comfortable. °· Supplemental oxygen. Supplemental oxygen is only helpful if you have a low oxygen level in your blood.   °· Exercise and physical activity. These are beneficial for nearly all people with COPD. Some people may also benefit from a pulmonary rehabilitation program. °HOME CARE INSTRUCTIONS  °· Take all medicines (inhaled or pills) as directed by your health care provider. °· Avoid over-the-counter medicines or cough syrups  that dry up your airway (such as antihistamines) and slow down the elimination of secretions unless instructed otherwise by your health care provider.   °· If you are a smoker, the most important thing that you can do is stop smoking. Continuing to smoke will cause further lung damage and breathing trouble. Ask your health care provider for help with quitting smoking. He or she can direct you to community resources or hospitals that provide support. °· Avoid exposure to irritants such as smoke, chemicals, and fumes that aggravate your breathing. °· Use oxygen therapy and pulmonary rehabilitation if directed by your health care provider. If you require home oxygen therapy, ask your health care provider whether you should purchase a pulse oximeter to measure your oxygen level at home.   °· Avoid contact with individuals who have a contagious illness. °· Avoid extreme temperature and humidity changes. °· Eat healthy foods. Eating smaller, more frequent meals and resting before meals may help you maintain your strength. °· Stay active, but balance activity with periods of rest. Exercise and physical activity will help you maintain your ability to do things you want to do. °· Preventing infection and hospitalization is very important when you have COPD. Make sure to receive all the vaccines your health care provider recommends, especially the pneumococcal and influenza vaccines. Ask your health care provider whether you need a pneumonia vaccine. °· Learn and use relaxation techniques to manage stress. °· Learn and use controlled breathing techniques as directed by your health care provider. Controlled breathing techniques include:   °¨ Pursed lip breathing. Start by breathing in (inhaling) through your nose for 1 second. Then, purse your lips as if you were   going to whistle and breathe out (exhale) through the pursed lips for 2 seconds.   °¨ Diaphragmatic breathing. Start by putting one hand on your abdomen just above  your waist. Inhale slowly through your nose. The hand on your abdomen should move out. Then purse your lips and exhale slowly. You should be able to feel the hand on your abdomen moving in as you exhale.   °· Learn and use controlled coughing to clear mucus from your lungs. Controlled coughing is a series of short, progressive coughs. The steps of controlled coughing are:   °1. Lean your head slightly forward.   °2. Breathe in deeply using diaphragmatic breathing.   °3. Try to hold your breath for 3 seconds.   °4. Keep your mouth slightly open while coughing twice.   °5. Spit any mucus out into a tissue.   °6. Rest and repeat the steps once or twice as needed. °SEEK MEDICAL CARE IF:  °· You are coughing up more mucus than usual.   °· There is a change in the color or thickness of your mucus.   °· Your breathing is more labored than usual.   °· Your breathing is faster than usual.   °SEEK IMMEDIATE MEDICAL CARE IF:  °· You have shortness of breath while you are resting.   °· You have shortness of breath that prevents you from: °¨ Being able to talk.   °¨ Performing your usual physical activities.   °· You have chest pain lasting longer than 5 minutes.   °· Your skin color is more cyanotic than usual. °· You measure low oxygen saturations for longer than 5 minutes with a pulse oximeter. °MAKE SURE YOU:  °· Understand these instructions. °· Will watch your condition. °· Will get help right away if you are not doing well or get worse. °Document Released: 07/29/2005 Document Revised: 03/05/2014 Document Reviewed: 06/15/2013 °[employer]® Patient Information ©2015 [employer]. This information is not intended to replace advice given to you by your health care provider. Make sure you discuss any questions you have with your health care provider. ° °Acute Bronchitis °Bronchitis is inflammation of the airways that extend from the windpipe into the lungs (bronchi). The inflammation often causes mucus to develop. This leads to  a cough, which is the most common symptom of bronchitis.  °In acute bronchitis, the condition usually develops suddenly and goes away over time, usually in a couple weeks. Smoking, allergies, and asthma can make bronchitis worse. Repeated episodes of bronchitis may cause further lung problems.  °CAUSES °Acute bronchitis is most often caused by the same virus that causes a cold. The virus can spread from person to person (contagious) through coughing, sneezing, and touching contaminated objects. °SIGNS AND SYMPTOMS  °· Cough.   °· Fever.   °· Coughing up mucus.   °· Body aches.   °· Chest congestion.   °· Chills.   °· Shortness of breath.   °· Sore throat.   °DIAGNOSIS  °Acute bronchitis is usually diagnosed through a physical exam. Your health care provider will also ask you questions about your medical history. Tests, such as chest X-rays, are sometimes done to rule out other conditions.  °TREATMENT  °Acute bronchitis usually goes away in a couple weeks. Oftentimes, no medical treatment is necessary. Medicines are sometimes given for relief of fever or cough. Antibiotic medicines are usually not needed but may be prescribed in certain situations. In some cases, an inhaler may be recommended to help reduce shortness of breath and control the cough. A cool mist vaporizer may also be used to help thin bronchial secretions and make it easier to   clear the chest.  °HOME CARE INSTRUCTIONS °· Get plenty of rest.   °· Drink enough fluids to keep your urine clear or pale yellow (unless you have a medical condition that requires fluid restriction). Increasing fluids may help thin your respiratory secretions (sputum) and reduce chest congestion, and it will prevent dehydration.   °· Take medicines only as directed by your health care provider. °· If you were prescribed an antibiotic medicine, finish it all even if you start to feel better. °· Avoid smoking and secondhand smoke. Exposure to cigarette smoke or irritating  chemicals will make bronchitis worse. If you are a smoker, consider using nicotine gum or skin patches to help control withdrawal symptoms. Quitting smoking will help your lungs heal faster.   °· Reduce the chances of another bout of acute bronchitis by washing your hands frequently, avoiding people with cold symptoms, and trying not to touch your hands to your mouth, nose, or eyes.   °· Keep all follow-up visits as directed by your health care provider.   °SEEK MEDICAL CARE IF: °Your symptoms do not improve after 1 week of treatment.  °SEEK IMMEDIATE MEDICAL CARE IF: °· You develop an increased fever or chills.   °· You have chest pain.   °· You have severe shortness of breath. °· You have bloody sputum.   °· You develop dehydration. °· You faint or repeatedly feel like you are going to pass out. °· You develop repeated vomiting. °· You develop a severe headache. °MAKE SURE YOU:  °· Understand these instructions. °· Will watch your condition. °· Will get help right away if you are not doing well or get worse. °Document Released: 11/26/2004 Document Revised: 03/05/2014 Document Reviewed: 04/11/2013 °[employer]® Patient Information ©2015 [employer]. This information is not intended to replace advice given to you by your health care provider. Make sure you discuss any questions you have with your health care provider. ° °

## 2015-04-19 NOTE — ED Notes (Signed)
Pt complaining of chest tightness that started 2 days ago. Pt denies any pain radiation, or weakness. Pt reports smoking half a pack of cigarettes a day and a history of chronic bronchitis. Pt reports using an inhaler every 4 hours and running out of medication within inhaler when pain started. Pt is A/O.

## 2015-04-19 NOTE — ED Provider Notes (Signed)
CSN: [id number]     Arrival date & time 04/19/15  7829 History   First MD Initiated Contact with Patient 04/19/15 [phone]     Chief Complaint  Patient presents with  . Chest Pain   Yolanda Hendricks is a 44 y.o. female with a history of bronchitis and a smoker who presents to the ED complaining of a cough and chest tightness for the past two days. Patient reports she was recently diagnosed with bronchitis and has been using her albuterol inhaler every four hours until about 3 days ago when she ran out. Since running out the patient has had chest tightness, coughing and shortness of breath. She does not have a PCP but she just got insurance and plans to follow up with one as soon as possible. She reports a nonproductive cough and chest tightness. Patient denies any chest pain, or palpitations. She reports smoking makes her symptoms worse. The patient denies fevers, chills, chest pain, palpitations, abdominal pain, nausea, vomiting, rashes, leg pain, or leg swelling.  (Consider location/radiation/quality/duration/timing/severity/associated sxs/prior Treatment) HPI  History reviewed. No pertinent past medical history. Past Surgical History  Procedure Laterality Date  . Eye surgery    . Tubal ligation     No family history on file. History  Substance Use Topics  . Smoking status: Current Some Day Smoker -- 0.50 packs/day    Types: Cigarettes  . Smokeless tobacco: Never Used  . Alcohol Use: 0.6 oz/week    1 Cans of beer per week     Comment: 2-3 40 oz cans   OB History    No data available     Review of Systems  Constitutional: Negative for fever and chills.  HENT: Negative for congestion, ear pain, sore throat and trouble swallowing.   Eyes: Negative for pain and visual disturbance.  Respiratory: Positive for cough, chest tightness, shortness of breath and wheezing.   Cardiovascular: Negative for chest pain, palpitations and leg swelling.  Gastrointestinal: Negative for nausea, vomiting,  abdominal pain and diarrhea.  Genitourinary: Negative for dysuria.  Musculoskeletal: Negative for back pain and neck pain.  Skin: Negative for rash.  Neurological: Negative for dizziness, syncope, weakness, light-headedness and headaches.      Allergies  Review of patient's allergies indicates no known allergies.  Home Medications   Prior to Admission medications   Medication Sig Start Date End Date Taking? Authorizing Provider  albuterol (PROVENTIL HFA;VENTOLIN HFA) 108 (90 BASE) MCG/ACT inhaler Inhale 1-2 puffs into the lungs every 4 (four) hours as needed for wheezing or shortness of breath. 04/19/15   Everlene Farrier, PA-C  benzonatate (TESSALON) 100 MG capsule Take 1 capsule (100 mg total) by mouth every 8 (eight) hours. Patient not taking: Reported on 04/19/2015 11/21/14   Jaynie Crumble, PA-C  chlorpheniramine-HYDROcodone (TUSSIONEX PENNKINETIC ER) 10-8 MG/5ML SUER Take 5 mLs by mouth every 12 (twelve) hours as needed for cough (and pain). Patient not taking: Reported on 04/19/2015 02/22/15   Trixie Dredge, PA-C  diphenhydrAMINE (BENADRYL) 25 MG tablet Take 1 tablet (25 mg total) by mouth every 6 (six) hours as needed for itching (Rash). Patient not taking: Reported on 11/21/2014 02/05/14   Francee Piccolo, PA-C  fluticasone (FLOVENT HFA) 110 MCG/ACT inhaler Inhale 1 puff into the lungs 2 (two) times daily. 04/19/15   Everlene Farrier, PA-C  hydrocortisone 1 % lotion Apply 1 application topically 2 (two) times daily. Patient not taking: Reported on 11/21/2014 02/05/14   Francee Piccolo, PA-C  naproxen (NAPROSYN) 375 MG tablet Take 1 tablet (  375 mg total) by mouth 2 (two) times daily. Patient not taking: Reported on 11/21/2014 01/26/13   Jaci Carrel, PA-C  predniSONE (DELTASONE) 20 MG tablet Take 2 tablets (40 mg total) by mouth daily. 04/19/15   Everlene Farrier, PA-C   BP 113/71 mmHg  Pulse 87  Temp(Src) 98.9 F (37.2 C) (Oral)  Resp 18  Ht  (1.702 m)  Wt 160 lb (72.576 kg)   BMI 25.05 kg/m2  SpO2 99%  LMP 04/05/2015 Physical Exam  Constitutional: She is oriented to person, place, and time. She appears well-developed and well-nourished. No distress.  Nontoxic-appearing.  HENT:  Head: Normocephalic and atraumatic.  Right Ear: External ear normal.  Left Ear: External ear normal.  Mouth/Throat: Oropharynx is clear and moist. No oropharyngeal exudate.  Eyes: Conjunctivae are normal. Pupils are equal, round, and reactive to light. Right eye exhibits no discharge. Left eye exhibits no discharge.  Neck: Normal range of motion. Neck supple. No JVD present. No tracheal deviation present.  Cardiovascular: Normal rate, regular rhythm, normal heart sounds and intact distal pulses.  Exam reveals no gallop and no friction rub.   No murmur heard. Bilateral radial, posterior tibialis and dorsalis pedis pulses are intact.    Pulmonary/Chest: Effort normal. No respiratory distress. She has wheezes. She has no rales.  Lung sounds diminished bilaterally with mild scattered wheezes. Patient speaking in full sentences.  Abdominal: Soft. She exhibits no distension. There is no tenderness.  Musculoskeletal: She exhibits no edema or tenderness.  No lower extremity edema or tenderness.  Lymphadenopathy:    She has no cervical adenopathy.  Neurological: She is alert and oriented to person, place, and time. Coordination normal.  Skin: Skin is warm and dry. No rash noted. She is not diaphoretic. No erythema. No pallor.  Psychiatric: She has a normal mood and affect. Her behavior is normal.  Nursing note and vitals reviewed.   ED Course  Procedures (including critical care time) Labs Review Labs Reviewed  BASIC METABOLIC PANEL - Abnormal; Notable for the following:    Glucose, Bld 121 (*)    Calcium 8.8 (*)    All other components within normal limits  CBC WITH DIFFERENTIAL/PLATELET - Abnormal; Notable for the following:    WBC 13.4 (*)    Neutro Abs 8.5 (*)    Monocytes  Absolute 1.1 (*)    Eosinophils Relative 6 (*)    All other components within normal limits    Imaging Review Dg Chest 2 View  04/19/2015   CLINICAL DATA:  Chest tightness for 2 days, initial encounter  EXAM: CHEST - 2 VIEW  COMPARISON:  11/21/2014  FINDINGS: Cardiac shadow is stable. The lungs are well aerated bilaterally. No focal infiltrate or sizable effusion is seen. No bony abnormality is noted.  IMPRESSION: No active disease.   Electronically Signed   By: Alcide Clever M.D.   On: 04/19/2015 10:29     EKG Interpretation   Date/Time:  Friday April 19 2015 09:13:16 EDT Ventricular Rate:  91 PR Interval:  136 QRS Duration: 92 QT Interval:  363 QTC Calculation: 447 R Axis:   53 Text Interpretation:  Sinus rhythm RSR' in V1 or V2, right VCD or RVH  Confirmed by Fayrene Fearing  MD, MARK ([id number]) on 04/19/2015 9:49:03 AM      Filed Vitals:   04/19/15 0913 04/19/15 0926 04/19/15 1106 04/19/15 1147  BP: 116/70 116/70 140/70 113/71  Pulse: 88 82 87 87  Temp: 99.3 F (37.4 C) 98.9 F (37.2  C)    TempSrc: Oral Oral    Resp: 16 18 19 18   Height: 5\' 7"  (1.702 m) 5\' 7"  (1.702 m)    Weight: 160 lb (72.576 kg) 160 lb (72.576 kg)    SpO2: 97% 99% 98% 99%     MDM   Meds given in ED:  Medications  albuterol (PROVENTIL) (2.5 MG/3ML) 0.083% nebulizer solution 5 mg (5 mg Nebulization Given 04/19/15 0931)  ipratropium (ATROVENT) nebulizer solution 0.5 mg (0.5 mg Nebulization Given 04/19/15 0931)    New Prescriptions   ALBUTEROL (PROVENTIL HFA;VENTOLIN HFA) 108 (90 BASE) MCG/ACT INHALER    Inhale 1-2 puffs into the lungs every 4 (four) hours as needed for wheezing or shortness of breath.   FLUTICASONE (FLOVENT HFA) 110 MCG/ACT INHALER    Inhale 1 puff into the lungs 2 (two) times daily.   PREDNISONE (DELTASONE) 20 MG TABLET    Take 2 tablets (40 mg total) by mouth daily.    Final diagnoses:  Chronic bronchitis with acute exacerbation   This is a 44 y.o. female with a history of bronchitis and a  smoker who presents to the ED complaining of a cough and chest tightness for the past two days. Patient reports she was recently diagnosed with bronchitis and has been using her albuterol inhaler every four hours until about 3 days ago when she ran out. Since running out the patient has had chest tightness, coughing and shortness of breath. She does not have a PCP but she just got insurance and plans to follow up with one as soon as possible.  On examination is afebrile and nontoxic appearing. She has diminished lung sounds bilaterally with mild scattered wheezes. Chest x-ray is unremarkable. CBC indicates a mild leukocytosis with a white count of 13.4. BMP is unremarkable. Had reevaluation the patient reports her chest tightness and shortness of breath had completely resolved after albuterol and Atrovent nebulizer. Lung sounds are improved on repeat examination. We'll discharge the patient with prescriptions for albuterol inhaler, Flovent and prednisone for chronic bronchitis with acute exacerbation. I advised that the patient needs to follow-up with her PCP for treatment of her chronic bronchitis. The patient reports she will do this as soon as possible. I advised the patient to follow-up with their primary care provider this week. I advised the patient to return to the emergency department with new or worsening symptoms or new concerns. The patient verbalized understanding and agreement with plan.    This patient was discussed with Dr. Fayrene Fearing who agrees with assessment and plan.     Everlene Farrier, PA-C 04/19/15 1215  Rolland Porter, MD 04/28/15 1728

## 2015-04-19 NOTE — ED Notes (Signed)
Pt undressed, in gown, on monitor, continuous pulse oximetry and blood pressure cuff 

## 2015-06-28 ENCOUNTER — Encounter (HOSPITAL_COMMUNITY): Payer: Self-pay | Admitting: *Deleted

## 2015-06-28 ENCOUNTER — Emergency Department (HOSPITAL_COMMUNITY)
Admission: EM | Admit: 2015-06-28 | Discharge: 2015-06-28 | Disposition: A | Discharge status: Home / Self Care | Payer: Managed Care, Other (non HMO) | Attending: Emergency Medicine | Admitting: Emergency Medicine

## 2015-06-28 DIAGNOSIS — R05 Cough: Secondary | ICD-10-CM

## 2015-06-28 DIAGNOSIS — R0602 Shortness of breath: Secondary | ICD-10-CM | POA: Diagnosis present

## 2015-06-28 DIAGNOSIS — Z7952 Long term (current) use of systemic steroids: Secondary | ICD-10-CM | POA: Diagnosis not present

## 2015-06-28 DIAGNOSIS — Z7951 Long term (current) use of inhaled steroids: Secondary | ICD-10-CM | POA: Diagnosis not present

## 2015-06-28 DIAGNOSIS — J45901 Unspecified asthma with (acute) exacerbation: Secondary | ICD-10-CM | POA: Diagnosis not present

## 2015-06-28 DIAGNOSIS — Z720 Tobacco use: Secondary | ICD-10-CM | POA: Insufficient documentation

## 2015-06-28 DIAGNOSIS — R059 Cough, unspecified: Secondary | ICD-10-CM

## 2015-06-28 HISTORY — DX: Unspecified asthma, uncomplicated: J45.909

## 2015-06-28 MED ORDER — PREDNISONE 20 MG PO TABS: ORAL_TABLET | ORAL | Status: DC

## 2015-06-28 MED ORDER — PREDNISONE 20 MG PO TABS
60.0000 mg | ORAL_TABLET | Freq: Once | ORAL | Status: AC
  Administered 2015-06-28: 60 mg via ORAL
  Filled 2015-06-28: qty 3

## 2015-06-28 MED ORDER — ALBUTEROL SULFATE HFA 108 (90 BASE) MCG/ACT IN AERS
2.0000 | INHALATION_SPRAY | Freq: Once | RESPIRATORY_TRACT | Status: AC
  Administered 2015-06-28: 2 via RESPIRATORY_TRACT
  Filled 2015-06-28: qty 6.7

## 2015-06-28 MED ORDER — ALBUTEROL SULFATE HFA 108 (90 BASE) MCG/ACT IN AERS: 2.0000 | INHALATION_SPRAY | RESPIRATORY_TRACT | Status: DC | PRN

## 2015-06-28 NOTE — ED Provider Notes (Signed)
CSN: [id number]     Arrival date & time 06/28/15  1237 History  This chart was scribed for non-physician practitioner Allen Derry, PA-C working with Benjiman Core, MD by Littie Deeds, ED Scribe. This patient was seen in room TR06C/TR06C and the patient's care was started at 12:45 PM.       Chief Complaint  Patient presents with  . Cough  . Asthma  . Shortness of Breath   Patient is a 44 y.o. female presenting with shortness of breath. The history is provided by the patient. No language interpreter was used.  Shortness of Breath Severity:  Mild Onset quality:  Gradual Duration:  12 hours Timing:  Constant Progression:  Unchanged Chronicity:  Recurrent Relieved by:  None tried Worsened by:  Nothing tried Ineffective treatments:  None tried Associated symptoms: cough and wheezing   Associated symptoms: no abdominal pain, no chest pain, no ear pain, no fever, no hemoptysis, no rash, no sore throat, no sputum production and no vomiting   Risk factors: tobacco use   Risk factors: no family hx of DVT, no hx of PE/DVT, no oral contraceptive use, no prolonged immobilization and no recent surgery    HPI Comments: Yolanda Hendricks is a 44 y.o. female with a history of asthma, who presents to the Emergency Department complaining of gradual onset SOB that started last night. Patient also reports having associated dry cough, mild wheezing, and chest tightness. Nothing aggravates her symptoms. She has not tried any treatments at home, she ran out of her inhalers. She feels like she is having a flare-up of asthma. Patient denies fever, chills, rhinorrhea, sore throat, ear pain, ear discharge, eye itching, watery eye discharge, chest pain, leg swelling, nausea, vomiting, abdominal pain, diarrhea, constipation, arthralgias, myalgias, rash, numbness, tingling, and weakness. She also denies sick contacts, recent travel, recent surgeries, immobilization, or history of blood clots. Patient does admit  to smoking. She also notes having seasonal/environmental allergies.  No PCP per patient, but she just got insurance therefore she will have one soon.  Past Medical History  Diagnosis Date  . Asthma    Past Surgical History  Procedure Laterality Date  . Eye surgery    . Tubal ligation     History reviewed. No pertinent family history. Social History  Substance Use Topics  . Smoking status: Current Some Day Smoker -- 0.50 packs/day    Types: Cigarettes  . Smokeless tobacco: Never Used  . Alcohol Use: 0.6 oz/week    1 Cans of beer per week     Comment: 2-3 40 oz cans   OB History    No data available     Review of Systems  Constitutional: Negative for fever and chills.  HENT: Negative for ear discharge, ear pain, rhinorrhea and sore throat.   Eyes: Negative for discharge and itching.  Respiratory: Positive for cough, chest tightness, shortness of breath and wheezing. Negative for hemoptysis and sputum production.   Cardiovascular: Negative for chest pain and leg swelling.  Gastrointestinal: Negative for nausea, vomiting, abdominal pain, diarrhea and constipation.  Musculoskeletal: Negative for myalgias and arthralgias.  Skin: Negative for rash.  Allergic/Immunologic: Positive for environmental allergies. Negative for immunocompromised state.  Neurological: Negative for weakness and numbness.   10 Systems reviewed and are negative for acute change except as noted in the HPI.    Allergies  Review of patient's allergies indicates no known allergies.  Home Medications   Prior to Admission medications   Medication Sig Start Date End Date  Taking? Authorizing Provider  albuterol (PROVENTIL HFA;VENTOLIN HFA) 108 (90 BASE) MCG/ACT inhaler Inhale 1-2 puffs into the lungs every 4 (four) hours as needed for wheezing or shortness of breath. 04/19/15   Everlene Farrier, PA-C  benzonatate (TESSALON) 100 MG capsule Take 1 capsule (100 mg total) by mouth every 8 (eight) hours. Patient not  taking: Reported on 04/19/2015 11/21/14   Jaynie Crumble, PA-C  chlorpheniramine-HYDROcodone (TUSSIONEX PENNKINETIC ER) 10-8 MG/5ML SUER Take 5 mLs by mouth every 12 (twelve) hours as needed for cough (and pain). Patient not taking: Reported on 04/19/2015 02/22/15   Trixie Dredge, PA-C  diphenhydrAMINE (BENADRYL) 25 MG tablet Take 1 tablet (25 mg total) by mouth every 6 (six) hours as needed for itching (Rash). Patient not taking: Reported on 11/21/2014 02/05/14   Francee Piccolo, PA-C  fluticasone (FLOVENT HFA) 110 MCG/ACT inhaler Inhale 1 puff into the lungs 2 (two) times daily. 04/19/15   Everlene Farrier, PA-C  hydrocortisone 1 % lotion Apply 1 application topically 2 (two) times daily. Patient not taking: Reported on 11/21/2014 02/05/14   Francee Piccolo, PA-C  naproxen (NAPROSYN) 375 MG tablet Take 1 tablet (375 mg total) by mouth 2 (two) times daily. Patient not taking: Reported on 11/21/2014 01/26/13   Jaci Carrel, PA-C  predniSONE (DELTASONE) 20 MG tablet Take 2 tablets (40 mg total) by mouth daily. 04/19/15   Everlene Farrier, PA-C   BP 127/84 mmHg  Pulse 90  Temp(Src) 98.1 F (36.7 C) (Oral)  Resp 18  SpO2 100%  LMP 06/21/2015 Physical Exam  Constitutional: She is oriented to person, place, and time. Vital signs are normal. She appears well-developed and well-nourished.  Non-toxic appearance. No distress.  Afebrile, nontoxic, NAD  HENT:  Head: Normocephalic and atraumatic.  Mouth/Throat: Oropharynx is clear and moist and mucous membranes are normal.  Eyes: Conjunctivae and EOM are normal. Right eye exhibits no discharge. Left eye exhibits no discharge.  Neck: Normal range of motion. Neck supple.  Cardiovascular: Normal rate, regular rhythm, normal heart sounds and intact distal pulses.  Exam reveals no gallop and no friction rub.   No murmur heard. Pulmonary/Chest: Effort normal. No respiratory distress. She has no decreased breath sounds. She has wheezes. She has no rhonchi. She has  no rales.  Scant expiratory wheezing diffusely throughout, prolonged expiration, no rhonchi or rales, no hypoxia or increased WOB, speaking in full sentences, SpO2 100% on RA.  Abdominal: Soft. Normal appearance and bowel sounds are normal. She exhibits no distension. There is no tenderness. There is no rigidity, no rebound, no guarding, no CVA tenderness, no tenderness at McBurney's point and negative Murphy's sign.  Musculoskeletal: Normal range of motion.  MAE x4 Strength and sensation grossly intact Distal pulses intact No pedal edema bilaterally   Neurological: She is alert and oriented to person, place, and time. She has normal strength. No sensory deficit.  Skin: Skin is warm, dry and intact. No rash noted.  Psychiatric: She has a normal mood and affect.  Nursing note and vitals reviewed.   ED Course  Procedures  DIAGNOSTIC STUDIES: Oxygen Saturation is 100% on room air, normal by my interpretation.    COORDINATION OF CARE: 12:48 PM-Discussed treatment plan which includes prednisone and albuterol inhaler with patient/guardian at bedside and patient/guardian agreed to plan.    Labs Review Labs Reviewed - No data to display  Imaging Review No results found. I have personally reviewed and evaluated these images and lab results as part of my medical decision-making.   EKG Interpretation  None      MDM   Final diagnoses:  Asthma exacerbation  Tobacco abuse  Cough  Shortness of breath    44 y.o. female here with mild SOB and cough x1 day, similar to prior asthma, slight wheezing and prolonged expiration on exam. No s/sx of DVT/PE, no hypoxia or tachycardia. No rhonchi or productive cough. Doubt need for labs or imaging, will give inhaler here and give short course of prednisone for asthma exacerbation. Discussed starting antihistamines. Will have her call ins comp today and find out who her PCP is going to be now that she's insured, and f/up with them in 1-2wks. Smoking  cessation advised. I explained the diagnosis and have given explicit precautions to return to the ER including for any other new or worsening symptoms. The patient understands and accepts the medical plan as it's been dictated and I have answered their questions. Discharge instructions concerning home care and prescriptions have been given. The patient is STABLE and is discharged to home in good condition.   I personally performed the services described in this documentation, which was scribed in my presence. The recorded information has been reviewed and is accurate.  BP 127/84 mmHg  Pulse 90  Temp(Src) 98.1 F (36.7 C) (Oral)  Resp 18  SpO2 100%  LMP 06/21/2015  Meds ordered this encounter  Medications  . albuterol (PROVENTIL HFA;VENTOLIN HFA) 108 (90 BASE) MCG/ACT inhaler 2 puff    Sig:   . predniSONE (DELTASONE) tablet 60 mg    Sig:   . albuterol (PROVENTIL HFA;VENTOLIN HFA) 108 (90 BASE) MCG/ACT inhaler    Sig: Inhale 2 puffs into the lungs every 2 (two) hours as needed for wheezing or shortness of breath (cough).    Dispense:  1 Inhaler    Refill:  0    Order Specific Question:  Supervising Provider    Answer:  MILLER, BRIAN [[id number]]  . predniSONE (DELTASONE) 20 MG tablet    Sig: 3 tabs po daily x 3 days    Dispense:  9 tablet    Refill:  0    Order Specific Question:  Supervising Provider    Answer:  Eber Hong [[id number]]      Yolanda Badon Camprubi-Soms, PA-C 06/28/15 1301  Benjiman Core, MD 06/28/15 1549

## 2015-06-28 NOTE — ED Notes (Signed)
Pt reports having cough and sob since last night. Pt has hx of asthma, states this feels the same as asthma in past and pt is out of her inhaler. spo2 100% at triage. Denies any other complaints.

## 2015-06-28 NOTE — Discharge Instructions (Signed)
Continue to stay well-hydrated. Use Mucinex for cough suppression/expectoration of mucus. Use netipot and flonase to help with nasal congestion. May consider over-the-counter Benadryl or other antihistamine to help with your symptoms. Use inhaler as directed as needed for cough/shortness of breath. Take prednisone as directed, beginning tomorrow since your first dose today was given in the emergency room. Followup with your primary care doctor from your insurance card in 5-7 days for recheck of ongoing symptoms. STOP SMOKING! Return to emergency department for emergent changing or worsening of symptoms.   Asthma Asthma is a condition of the lungs in which the airways tighten and narrow. Asthma can make it hard to breathe. Asthma cannot be cured, but medicine and lifestyle changes can help control it. Asthma may be started (triggered) by:  Animal skin flakes (dander).  Dust.  Cockroaches.  Pollen.  Mold.  Smoke.  Cleaning products.  Hair sprays or aerosol sprays.  Paint fumes or strong smells.  Cold air, weather changes, and winds.  Crying or laughing hard.  Stress.  Certain medicines or drugs.  Foods, such as dried fruit, potato chips, and sparkling grape juice.  Infections or conditions (colds, flu).  Exercise.  Certain medical conditions or diseases.  Exercise or tiring activities. HOME CARE   Take medicine as told by your doctor.  Use a peak flow meter as told by your doctor. A peak flow meter is a tool that measures how well the lungs are working.  Record and keep track of the peak flow meter's readings.  Understand and use the asthma action plan. An asthma action plan is a written plan for taking care of your asthma and treating your attacks.  To help prevent asthma attacks:  Do not smoke. Stay away from secondhand smoke.  Change your heating and air conditioning filter often.  Limit your use of fireplaces and wood stoves.  Get rid of pests (such as  roaches and mice) and their droppings.  Throw away plants if you see mold on them.  Clean your floors. Dust regularly. Use cleaning products that do not smell.  Have someone vacuum when you are not home. Use a vacuum cleaner with a HEPA filter if possible.  Replace carpet with wood, tile, or vinyl flooring. Carpet can trap animal skin flakes and dust.  Use allergy-proof pillows, mattress covers, and box spring covers.  Wash bed sheets and blankets every week in hot water and dry them in a dryer.  Use blankets that are made of polyester or cotton.  Clean bathrooms and kitchens with bleach. If possible, have someone repaint the walls in these rooms with mold-resistant paint. Keep out of the rooms that are being cleaned and painted.  Wash hands often. GET HELP IF:  You have make a whistling sound when breaking (wheeze), have shortness of breath, or have a cough even if taking medicine to prevent attacks.  The colored mucus you cough up (sputum) is thicker than usual.  The colored mucus you cough up changes from clear or white to yellow, green, gray, or bloody.  You have problems from the medicine you are taking such as:  A rash.  Itching.  Swelling.  Trouble breathing.  You need reliever medicines more than 2-3 times a week.  Your peak flow measurement is still at 50-79% of your personal best after following the action plan for 1 hour.  You have a fever. GET HELP RIGHT AWAY IF:   You seem to be worse and are not responding to medicine  during an asthma attack.  You are short of breath even at rest.  You get short of breath when doing very little activity.  You have trouble eating, drinking, or talking.  You have chest pain.  You have a fast heartbeat.  Your lips or fingernails start to turn blue.  You are light-headed, dizzy, or faint.  Your peak flow is less than 50% of your personal best. MAKE SURE YOU:   Understand these instructions.  Will watch your  condition.  Will get help right away if you are not doing well or get worse. Document Released: 04/06/2008 Document Revised: 03/05/2014 Document Reviewed: 05/18/2013 [facility] Patient Information 2015 [place], [place]. This information is not intended to replace advice given to you by your health care provider. Make sure you discuss any questions you have with your health care provider.  Cough, Adult  A cough is a reflex. It helps you clear your throat and airways. A cough can help heal your body. A cough can last 2 or 3 weeks (acute) or may last more than 8 weeks (chronic). Some common causes of a cough can include an infection, allergy, or a cold. HOME CARE  Only take medicine as told by your doctor.  If given, take your medicines (antibiotics) as told. Finish them even if you start to feel better.  Use a cold steam vaporizer or humidifier in your home. This can help loosen thick spit (secretions).  Sleep so you are almost sitting up (semi-upright). Use pillows to do this. This helps reduce coughing.  Rest as needed.  Stop smoking if you smoke. GET HELP RIGHT AWAY IF:  You have yellowish-white fluid (pus) in your thick spit.  Your cough gets worse.  Your medicine does not reduce coughing, and you are losing sleep.  You cough up blood.  You have trouble breathing.  Your pain gets worse and medicine does not help.  You have a fever. MAKE SURE YOU:   Understand these instructions.  Will watch your condition.  Will get help right away if you are not doing well or get worse. Document Released: 07/02/2011 Document Revised: 03/05/2014 Document Reviewed: 07/02/2011 [facility] Patient Information 2015 [place], [place]. This information is not intended to replace advice given to you by your health care provider. Make sure you discuss any questions you have with your health care provider.  How to Use an Inhaler Using your inhaler correctly is very important. Good technique will make  sure that the medicine reaches your lungs.  HOW TO USE AN INHALER:  Take the cap off the inhaler.  If this is the first time using your inhaler, you need to prime it. Shake the inhaler for 5 seconds. Release four puffs into the air, away from your face. Ask your doctor for help if you have questions.  Shake the inhaler for 5 seconds.  Turn the inhaler so the bottle is above the mouthpiece.  Put your pointer finger on top of the bottle. Your thumb holds the bottom of the inhaler.  Open your mouth.  Either hold the inhaler away from your mouth (the width of 2 fingers) or place your lips tightly around the mouthpiece. Ask your doctor which way to use your inhaler.  Breathe out as much air as possible.  Breathe in and push down on the bottle 1 time to release the medicine. You will feel the medicine go in your mouth and throat.  Continue to take a deep breath in very slowly. Try to fill your  lungs.  After you have breathed in completely, hold your breath for 10 seconds. This will help the medicine to settle in your lungs. If you cannot hold your breath for 10 seconds, hold it for as long as you can before you breathe out.  Breathe out slowly, through pursed lips. Whistling is an example of pursed lips.  If your doctor has told you to take more than 1 puff, wait at least 15-30 seconds between puffs. This will help you get the best results from your medicine. Do not use the inhaler more than your doctor tells you to.  Put the cap back on the inhaler.  Follow the directions from your doctor or from the inhaler package about cleaning the inhaler. If you use more than one inhaler, ask your doctor which inhalers to use and what order to use them in. Ask your doctor to help you figure out when you will need to refill your inhaler.  If you use a steroid inhaler, always rinse your mouth with water after your last puff, gargle and spit out the water. Do not swallow the water. GET HELP IF:  The  inhaler medicine only partially helps to stop wheezing or shortness of breath.  You are having trouble using your inhaler.  You have some increase in thick spit (phlegm). GET HELP RIGHT AWAY IF:  The inhaler medicine does not help your wheezing or shortness of breath or you have tightness in your chest.  You have dizziness, headaches, or fast heart rate.  You have chills, fever, or night sweats.  You have a large increase of thick spit, or your thick spit is bloody. MAKE SURE YOU:   Understand these instructions.  Will watch your condition.  Will get help right away if you are not doing well or get worse. Document Released: 07/28/2008 Document Revised: 08/09/2013 Document Reviewed: 05/18/2013 [facility] Patient Information 2015 [place], [place]. This information is not intended to replace advice given to you by your health care provider. Make sure you discuss any questions you have with your health care provider.  Smoking Cessation, Tips for Success If you are ready to quit smoking, congratulations! You have chosen to help yourself be healthier. Cigarettes bring nicotine, tar, carbon monoxide, and other irritants into your body. Your lungs, heart, and blood vessels will be able to work better without these poisons. There are many different ways to quit smoking. Nicotine gum, nicotine patches, a nicotine inhaler, or nicotine nasal spray can help with physical craving. Hypnosis, support groups, and medicines help break the habit of smoking. WHAT THINGS CAN I DO TO MAKE QUITTING EASIER?  Here are some tips to help you quit for good:  Pick a date when you will quit smoking completely. Tell all of your friends and family about your plan to quit on that date.  Do not try to slowly cut down on the number of cigarettes you are smoking. Pick a quit date and quit smoking completely starting on that day.  Throw away all cigarettes.   Clean and remove all ashtrays from your home, work, and  car.  On a card, write down your reasons for quitting. Carry the card with you and read it when you get the urge to smoke.  Cleanse your body of nicotine. Drink enough water and fluids to keep your urine clear or pale yellow. Do this after quitting to flush the nicotine from your body.  Learn to predict your moods. Do not let a bad situation be your  excuse to have a cigarette. Some situations in your life might tempt you into wanting a cigarette.  Never have "just one" cigarette. It leads to wanting another and another. Remind yourself of your decision to quit.  Change habits associated with smoking. If you smoked while driving or when feeling stressed, try other activities to replace smoking. Stand up when drinking your coffee. Brush your teeth after eating. Sit in a different chair when you read the paper. Avoid alcohol while trying to quit, and try to drink fewer caffeinated beverages. Alcohol and caffeine may urge you to smoke.  Avoid foods and drinks that can trigger a desire to smoke, such as sugary or spicy foods and alcohol.  Ask people who smoke not to smoke around you.  Have something planned to do right after eating or having a cup of coffee. For example, plan to take a walk or exercise.  Try a relaxation exercise to calm you down and decrease your stress. Remember, you may be tense and nervous for the first 2 weeks after you quit, but this will pass.  Find new activities to keep your hands busy. Play with a pen, coin, or rubber band. Doodle or draw things on paper.  Brush your teeth right after eating. This will help cut down on the craving for the taste of tobacco after meals. You can also try mouthwash.   Use oral substitutes in place of cigarettes. Try using lemon drops, carrots, cinnamon sticks, or chewing gum. Keep them handy so they are available when you have the urge to smoke.  When you have the urge to smoke, try deep breathing.  Designate your home as a nonsmoking  area.  If you are a heavy smoker, ask your health care provider about a prescription for nicotine chewing gum. It can ease your withdrawal from nicotine.  Reward yourself. Set aside the cigarette money you save and buy yourself something nice.  Look for support from others. Join a support group or smoking cessation program. Ask someone at home or at work to help you with your plan to quit smoking.  Always ask yourself, "Do I need this cigarette or is this just a reflex?" Tell yourself, "Today, I choose not to smoke," or "I do not want to smoke." You are reminding yourself of your decision to quit.  Do not replace cigarette smoking with electronic cigarettes (commonly called e-cigarettes). The safety of e-cigarettes is unknown, and some may contain harmful chemicals.  If you relapse, do not give up! Plan ahead and think about what you will do the next time you get the urge to smoke. HOW WILL I FEEL WHEN I QUIT SMOKING? You may have symptoms of withdrawal because your body is used to nicotine (the addictive substance in cigarettes). You may crave cigarettes, be irritable, feel very hungry, cough often, get headaches, or have difficulty concentrating. The withdrawal symptoms are only temporary. They are strongest when you first quit but will go away within 10-14 days. When withdrawal symptoms occur, stay in control. Think about your reasons for quitting. Remind yourself that these are signs that your body is healing and getting used to being without cigarettes. Remember that withdrawal symptoms are easier to treat than the major diseases that smoking can cause.  Even after the withdrawal is over, expect periodic urges to smoke. However, these cravings are generally short lived and will go away whether you smoke or not. Do not smoke! WHAT RESOURCES ARE AVAILABLE TO HELP ME QUIT SMOKING? Your  health care provider can direct you to community resources or hospitals for support, which may include:  Group  support.  Education.  Hypnosis.  Therapy. Document Released: 07/17/2004 Document Revised: 03/05/2014 Document Reviewed: 04/06/2013 [facility] Patient Information 2015 [place], [place]. This information is not intended to replace advice given to you by your health care provider. Make sure you discuss any questions you have with your health care provider.

## 2015-07-22 ENCOUNTER — Emergency Department (HOSPITAL_COMMUNITY)
Admission: EM | Admit: 2015-07-22 | Discharge: 2015-07-22 | Disposition: A | Discharge status: Home / Self Care | Payer: Managed Care, Other (non HMO) | Attending: Emergency Medicine | Admitting: Emergency Medicine

## 2015-07-22 ENCOUNTER — Encounter (HOSPITAL_COMMUNITY): Payer: Self-pay | Admitting: *Deleted

## 2015-07-22 ENCOUNTER — Emergency Department (HOSPITAL_COMMUNITY): Payer: Managed Care, Other (non HMO)

## 2015-07-22 DIAGNOSIS — J45901 Unspecified asthma with (acute) exacerbation: Secondary | ICD-10-CM | POA: Diagnosis not present

## 2015-07-22 DIAGNOSIS — R0602 Shortness of breath: Secondary | ICD-10-CM | POA: Diagnosis present

## 2015-07-22 DIAGNOSIS — Z79899 Other long term (current) drug therapy: Secondary | ICD-10-CM | POA: Diagnosis not present

## 2015-07-22 DIAGNOSIS — Z720 Tobacco use: Secondary | ICD-10-CM | POA: Insufficient documentation

## 2015-07-22 MED ORDER — DEXAMETHASONE SODIUM PHOSPHATE 10 MG/ML IJ SOLN: 0.6000 mg/kg | Freq: Once | INTRAMUSCULAR | Status: DC

## 2015-07-22 MED ORDER — DEXAMETHASONE SODIUM PHOSPHATE 10 MG/ML IJ SOLN
10.0000 mg | Freq: Once | INTRAMUSCULAR | Status: AC
  Administered 2015-07-22: 10 mg via INTRAMUSCULAR
  Filled 2015-07-22: qty 1

## 2015-07-22 MED ORDER — ALBUTEROL SULFATE (2.5 MG/3ML) 0.083% IN NEBU
5.0000 mg | INHALATION_SOLUTION | Freq: Once | RESPIRATORY_TRACT | Status: AC
  Administered 2015-07-22: 5 mg via RESPIRATORY_TRACT
  Filled 2015-07-22: qty 6

## 2015-07-22 MED ORDER — ALBUTEROL SULFATE HFA 108 (90 BASE) MCG/ACT IN AERS: 2.0000 | INHALATION_SPRAY | RESPIRATORY_TRACT | Status: DC | PRN

## 2015-07-22 NOTE — Progress Notes (Signed)
EDCM spoke to patient at bedside.  Patient confirms she has [place] insurance without a pcp.  EDCm provided patient with list of pcps who accept [place] insurance within a ten mile radius of patient's zip code [address].  Discussed purpose and beefits of having a pcp, discussed ED to be used for emergency purposes and not as a pcp.  Patient verbalized understanding.  Patient thankful for services.  No further EDCM needs at this time.

## 2015-07-22 NOTE — Discharge Instructions (Signed)
It is important for you to establish care with a primary care doctor who can better monitor and control your asthma Refill on albuterol inhaler given    Bronchospasm A bronchospasm is when the tubes that carry air in and out of your lungs (airways) spasm or tighten. During a bronchospasm it is hard to breathe. This is because the airways get smaller. A bronchospasm can be triggered by:  Allergies. These may be to animals, pollen, food, or mold.  Infection. This is a common cause of bronchospasm.  Exercise.  Irritants. These include pollution, cigarette smoke, strong odors, aerosol sprays, and paint fumes.  Weather changes.  Stress.  Being emotional. HOME CARE   Always have a plan for getting help. Know when to call your doctor and local emergency services (911 in the [place] Know where you can get emergency care.  Only take medicines as told by your doctor.  If you were prescribed an inhaler or nebulizer machine, ask your doctor how to use it correctly. Always use a spacer with your inhaler if you were given one.  Stay calm during an attack. Try to relax and breathe more slowly.  Control your home environment:  Change your heating and air conditioning filter at least once a month.  Limit your use of fireplaces and wood stoves.  Do not  smoke. Do not  allow smoking in your home.  Avoid perfumes and fragrances.  Get rid of pests (such as roaches and mice) and their droppings.  Throw away plants if you see mold on them.  Keep your house clean and dust free.  Replace carpet with wood, tile, or vinyl flooring. Carpet can trap dander and dust.  Use allergy-proof pillows, mattress covers, and box spring covers.  Wash bed sheets and blankets every week in hot water. Dry them in a dryer.  Use blankets that are made of polyester or cotton.  Wash hands frequently. GET HELP IF:  You have muscle aches.  You have chest pain.  The thick spit you spit or cough up (sputum)  changes from clear or white to yellow, green, gray, or bloody.  The thick spit you spit or cough up gets thicker.  There are problems that may be related to the medicine you are given such as:  A rash.  Itching.  Swelling.  Trouble breathing. GET HELP RIGHT AWAY IF:  You feel you cannot breathe or catch your breath.  You cannot stop coughing.  Your treatment is not helping you breathe better.  You have very bad chest pain. MAKE SURE YOU:   Understand these instructions.  Will watch your condition.  Will get help right away if you are not doing well or get worse. Document Released: 08/16/2009 Document Revised: 10/24/2013 Document Reviewed: 04/11/2013 [facility] Patient Information 2015 [place], [place]. This information is not intended to replace advice given to you by your health care provider. Make sure you discuss any questions you have with your health care provider.

## 2015-07-22 NOTE — ED Provider Notes (Signed)
CSN: [id number]     Arrival date & time 07/22/15  1652 History   First MD Initiated Contact with Patient 07/22/15 2024     Chief Complaint  Patient presents with  . Shortness of Breath    HPI Comments: Patient presents today for shortness of breath. She says she is having trouble catching her breath. Started after she was at work at [employer]. 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 Patient's symptoms usually relieved by inhaler patient has been out of her inhalers. She does not have a PCP who she follows up with. Does not feel like this is a severe episode. Has had no wheezing. Denies any recent illness.  Patient is a 44 y.o. female presenting with shortness of breath.  Shortness of Breath Severity:  Mild Onset quality:  Gradual Context: activity   Relieved by:  Inhaler Associated symptoms: cough   Associated symptoms: no chest pain, no fever, no vomiting and no wheezing     Past Medical History  Diagnosis Date  . Asthma    Past Surgical History  Procedure Laterality Date  . Eye surgery    . Tubal ligation     No family history on file. Social History  Substance Use Topics  . Smoking status: Current Some Day Smoker -- 0.50 packs/day    Types: Cigarettes  . Smokeless tobacco: Never Used  . Alcohol Use: 0.6 oz/week    1 Cans of beer per week     Comment: 2-3 40 oz cans   OB History    No data available     Review of Systems  Constitutional: Negative for fever.  Respiratory: Positive for cough and shortness of breath. Negative for wheezing.   Cardiovascular: Negative for chest pain.  Gastrointestinal: Negative for vomiting.  Also per HPI  Allergies  Review of patient's allergies indicates no known allergies.  Home Medications   Prior to Admission medications   Medication Sig Start Date End Date Taking? Authorizing Provider  albuterol (PROVENTIL HFA;VENTOLIN HFA) 108 (90 BASE) MCG/ACT inhaler Inhale  2 puffs into the lungs every 4 (four) hours as needed for wheezing or shortness of breath (cough). 07/22/15   Pincus Large, DO  benzonatate (TESSALON) 100 MG capsule Take 1 capsule (100 mg total) by mouth every 8 (eight) hours. Patient not taking: Reported on 04/19/2015 11/21/14   Jaynie Crumble, PA-C  chlorpheniramine-HYDROcodone (TUSSIONEX PENNKINETIC ER) 10-8 MG/5ML SUER Take 5 mLs by mouth every 12 (twelve) hours as needed for cough (and pain). Patient not taking: Reported on 04/19/2015 02/22/15   Trixie Dredge, PA-C  diphenhydrAMINE (BENADRYL) 25 MG tablet Take 1 tablet (25 mg total) by mouth every 6 (six) hours as needed for itching (Rash). Patient not taking: Reported on 11/21/2014 02/05/14   Francee Piccolo, PA-C  fluticasone (FLOVENT HFA) 110 MCG/ACT inhaler Inhale 1 puff into the lungs 2 (two) times daily. Patient not taking: Reported on 07/22/2015 04/19/15   Everlene Farrier, PA-C  hydrocortisone 1 % lotion Apply 1 application topically 2 (two) times daily. Patient not taking: Reported on 11/21/2014 02/05/14   Francee Piccolo, PA-C  naproxen (NAPROSYN) 375 MG tablet Take 1 tablet (375 mg total) by mouth 2 (two) times daily. Patient not taking: Reported on 11/21/2014 01/26/13   Jaci Carrel, PA-C  predniSONE (DELTASONE) 20 MG tablet Take 2 tablets (40 mg total) by mouth daily. Patient not taking: Reported on 07/22/2015 04/19/15   Everlene Farrier, PA-C  predniSONE (DELTASONE) 20 MG tablet 3 tabs po daily x 3 days Patient not taking: Reported on 07/22/2015 06/28/15   Mercedes Camprubi-Soms, PA-C   BP 116/77 mmHg  Pulse 71  Temp(Src) 97.9 F (36.6 C) (Oral)  Resp 18  Wt 160 lb (72.576 kg)  SpO2 98%  LMP 07/15/2015 Physical Exam  Constitutional: She is oriented to person, place, and time. She appears well-developed and well-nourished. No distress.  HENT:  Head: Normocephalic and atraumatic.  Mouth/Throat: Oropharynx is clear and moist.  Eyes: EOM are normal.  Neck: Normal range of motion.   Cardiovascular: Normal rate, regular rhythm, normal heart sounds and intact distal pulses.   Pulmonary/Chest: Effort normal and breath sounds normal. No respiratory distress. She has no wheezes.  Abdominal: Soft. Bowel sounds are normal. There is no tenderness.  Musculoskeletal: Normal range of motion.  Neurological: She is alert and oriented to person, place, and time.  Grossly non-focal   Skin: Skin is warm and dry.  Psychiatric: She has a normal mood and affect.    ED Course  Procedures (including critical care time) Labs Review Labs Reviewed - No data to display  Imaging Review Dg Chest 2 View  07/22/2015   CLINICAL DATA:  Shortness of breath for 1 day  EXAM: CHEST - 2 VIEW  COMPARISON:  None.  FINDINGS: The heart size and mediastinal contours are within normal limits. Both lungs are clear. The visualized skeletal structures are unremarkable.  IMPRESSION: No active disease.   Electronically Signed   By: Alcide Clever M.D.   On: 07/22/2015 18:22   I have personally reviewed and evaluated these images and lab results as part of my medical decision-making.   EKG Interpretation None      MDM   Final diagnoses:  Asthma exacerbation   Patient presented to the ED with shortness of breath. Most likely having a mild asthma exacerbation. Patient ran out of her inhalers which usually help her dyspnea. Vitals are stable.  In ED, patient received albuterol nebulizer and and IM Decadron .   Discharge patient in stable condition. List of PCP's given to her and she was encouraged to establish care to avoid recurrent ED visits. Refill of albuterol given. Discussed return precautions.    Caryl Ada, DO 07/22/2015, 10:23 PM PGY-2, [facility]    Pincus Large, DO 07/22/15 2226  Nelva Nay, MD 07/22/15 [phone]

## 2015-07-22 NOTE — ED Notes (Signed)
Pt reports SOB since last night.  Pt has hx of asthma.  Pt denies any cp at this time

## 2015-08-10 ENCOUNTER — Emergency Department (HOSPITAL_COMMUNITY)
Admission: EM | Admit: 2015-08-10 | Discharge: 2015-08-10 | Disposition: A | Discharge status: Home / Self Care | Payer: Managed Care, Other (non HMO) | Attending: Emergency Medicine | Admitting: Emergency Medicine

## 2015-08-10 ENCOUNTER — Encounter (HOSPITAL_COMMUNITY): Payer: Self-pay

## 2015-08-10 DIAGNOSIS — Z3202 Encounter for pregnancy test, result negative: Secondary | ICD-10-CM | POA: Insufficient documentation

## 2015-08-10 DIAGNOSIS — J45909 Unspecified asthma, uncomplicated: Secondary | ICD-10-CM | POA: Insufficient documentation

## 2015-08-10 DIAGNOSIS — N938 Other specified abnormal uterine and vaginal bleeding: Secondary | ICD-10-CM | POA: Diagnosis present

## 2015-08-10 DIAGNOSIS — F17210 Nicotine dependence, cigarettes, uncomplicated: Secondary | ICD-10-CM | POA: Diagnosis not present

## 2015-08-10 DIAGNOSIS — N946 Dysmenorrhea, unspecified: Secondary | ICD-10-CM | POA: Diagnosis not present

## 2015-08-10 LAB — WET PREP, GENITAL
CLUE CELLS WET PREP: NONE SEEN
TRICH WET PREP: NONE SEEN
YEAST WET PREP: NONE SEEN

## 2015-08-10 LAB — POC URINE PREG, ED: PREG TEST UR: NEGATIVE

## 2015-08-10 MED ORDER — IBUPROFEN 800 MG PO TABS: 800.0000 mg | ORAL_TABLET | Freq: Three times a day (TID) | ORAL | Status: DC | PRN

## 2015-08-10 MED ORDER — IBUPROFEN 800 MG PO TABS
800.0000 mg | ORAL_TABLET | Freq: Once | ORAL | Status: AC
  Administered 2015-08-10: 800 mg via ORAL
  Filled 2015-08-10: qty 1

## 2015-08-10 NOTE — Discharge Instructions (Signed)
Dysmenorrhea Call the women's health clinic in 2 days to schedule the next available appointment Dysmenorrhea is pain during a menstrual period. You will have pain in the lower belly (abdomen). The pain is caused by the tightening (contracting) of the muscles of the uterus. The pain can be minor or severe. Headache, feeling sick to your stomach (nausea), throwing up (vomiting), or low back pain may occur with this condition. HOME CARE  Only take medicine as told by your doctor.  Place a heating pad or hot water bottle on your lower back or belly. Do not sleep with a heating pad.  Exercise may help lessen the pain.  Massage the lower back or belly.  Stop smoking.  Avoid alcohol and caffeine. GET HELP IF:   Your pain does not get better with medicine.  You have pain during sex.  Your pain gets worse while taking pain medicine.  Your period bleeding is heavier than normal.  You keep feeling sick to your stomach or keep throwing up. GET HELP RIGHT AWAY IF: You pass out (faint).   This information is not intended to replace advice given to you by your health care provider. Make sure you discuss any questions you have with your health care provider.   Document Released: 01/15/2009 Document Revised: 10/24/2013 Document Reviewed: 04/06/2013 Elsevier Interactive Patient Education [employer]

## 2015-08-10 NOTE — ED Notes (Signed)
Pt here with c/o heavy vaginal bleeding as well as abd and lower back cramping. Pt reports she started her menstrual cycle last night. Pt reports hx of possible fibroids.

## 2015-08-10 NOTE — ED Provider Notes (Signed)
CSN: [id number]     Arrival date & time 08/10/15  4098 History   First MD Initiated Contact with Patient 08/10/15 [phone]     Chief Complaint  Patient presents with  . Abdominal Cramping  . Vaginal Bleeding     (Consider location/radiation/quality/duration/timing/severity/associated sxs/prior Treatment) HPI Complains of low abdominal cramping, radiating to lower back onset yesterday with the onset of her menstrual period. Pain is typical menstrual cramps only this. Is particularly bad. No treatment prior to coming here. Nothing makes symptoms better or worse. No other associated symptoms. Past Medical History  Diagnosis Date  . Asthma    Past Surgical History  Procedure Laterality Date  . Eye surgery    . Tubal ligation     History reviewed. No pertinent family history. Social History  Substance Use Topics  . Smoking status: Current Some Day Smoker -- 0.50 packs/day    Types: Cigarettes  . Smokeless tobacco: Never Used  . Alcohol Use: 0.6 oz/week    1 Cans of beer per week     Comment: 2-3 40 oz cans   OB History    No data available     G3 P3 003 Review of Systems  Constitutional: Negative.   HENT: Negative.   Respiratory: Negative.   Cardiovascular: Negative.   Gastrointestinal: Negative.   Genitourinary: Positive for menstrual problem.  Musculoskeletal: Negative.   Skin: Negative.   Neurological: Negative.   Psychiatric/Behavioral: Negative.   All other systems reviewed and are negative.     Allergies  Review of patient's allergies indicates no known allergies.  Home Medications   Prior to Admission medications   Medication Sig Start Date End Date Taking? Authorizing Provider  albuterol (PROVENTIL HFA;VENTOLIN HFA) 108 (90 BASE) MCG/ACT inhaler Inhale 2 puffs into the lungs every 4 (four) hours as needed for wheezing or shortness of breath (cough). 07/22/15   Pincus Large, DO  benzonatate (TESSALON) 100 MG capsule Take 1 capsule (100 mg total) by mouth  every 8 (eight) hours. Patient not taking: Reported on 04/19/2015 11/21/14   Jaynie Crumble, PA-C  chlorpheniramine-HYDROcodone (TUSSIONEX PENNKINETIC ER) 10-8 MG/5ML SUER Take 5 mLs by mouth every 12 (twelve) hours as needed for cough (and pain). Patient not taking: Reported on 04/19/2015 02/22/15   Trixie Dredge, PA-C  diphenhydrAMINE (BENADRYL) 25 MG tablet Take 1 tablet (25 mg total) by mouth every 6 (six) hours as needed for itching (Rash). Patient not taking: Reported on 11/21/2014 02/05/14   Francee Piccolo, PA-C  fluticasone (FLOVENT HFA) 110 MCG/ACT inhaler Inhale 1 puff into the lungs 2 (two) times daily. Patient not taking: Reported on 07/22/2015 04/19/15   Everlene Farrier, PA-C  hydrocortisone 1 % lotion Apply 1 application topically 2 (two) times daily. Patient not taking: Reported on 11/21/2014 02/05/14   Francee Piccolo, PA-C  naproxen (NAPROSYN) 375 MG tablet Take 1 tablet (375 mg total) by mouth 2 (two) times daily. Patient not taking: Reported on 11/21/2014 01/26/13   Jaci Carrel, PA-C  predniSONE (DELTASONE) 20 MG tablet Take 2 tablets (40 mg total) by mouth daily. Patient not taking: Reported on 07/22/2015 04/19/15   Everlene Farrier, PA-C  predniSONE (DELTASONE) 20 MG tablet 3 tabs po daily x 3 days Patient not taking: Reported on 07/22/2015 06/28/15   Mercedes Camprubi-Soms, PA-C   BP 139/86 mmHg  Pulse 93  Temp(Src) 98.8 F (37.1 C) (Oral)  Resp 16  Ht  (1.702 m)  Wt 153 lb (69.4 kg)  BMI 23.96 kg/m2  SpO2 99%  LMP 08/09/2015 Physical Exam Results for orders placed or performed during the hospital encounter of 08/10/15  Wet prep, genital  Result Value Ref Range   Yeast Wet Prep HPF POC NONE SEEN NONE SEEN   Trich, Wet Prep NONE SEEN NONE SEEN   Clue Cells Wet Prep HPF POC NONE SEEN NONE SEEN   WBC, Wet Prep HPF POC MANY (A) NONE SEEN  POC urine preg, ED (not at [facility])  Result Value Ref Range   Preg Test, Ur NEGATIVE NEGATIVE   Dg Chest 2 View  07/22/2015    CLINICAL DATA:  Shortness of breath for 1 day  EXAM: CHEST - 2 VIEW  COMPARISON:  None.  FINDINGS: The heart size and mediastinal contours are within normal limits. Both lungs are clear. The visualized skeletal structures are unremarkable.  IMPRESSION: No active disease.   Electronically Signed   By: Alcide Clever M.D.   On: 07/22/2015 18:22    ED Course  Procedures (including critical care time) Labs Review Labs Reviewed - No data to display  Imaging Review No results found. I have personally reviewed and evaluated these images and lab results as part of my medical decision-making.   EKG Interpretation None      1:45 PM patient feels much improved after treatment with ibuprofen. Results for orders placed or performed during the hospital encounter of 08/10/15  Wet prep, genital  Result Value Ref Range   Yeast Wet Prep HPF POC NONE SEEN NONE SEEN   Trich, Wet Prep NONE SEEN NONE SEEN   Clue Cells Wet Prep HPF POC NONE SEEN NONE SEEN   WBC, Wet Prep HPF POC MANY (A) NONE SEEN  POC urine preg, ED (not at [facility])  Result Value Ref Range   Preg Test, Ur NEGATIVE NEGATIVE   Dg Chest 2 View  07/22/2015   CLINICAL DATA:  Shortness of breath for 1 day  EXAM: CHEST - 2 VIEW  COMPARISON:  None.  FINDINGS: The heart size and mediastinal contours are within normal limits. Both lungs are clear. The visualized skeletal structures are unremarkable.  IMPRESSION: No active disease.   Electronically Signed   By: Alcide Clever M.D.   On: 07/22/2015 18:22    MDM  Plan prescription ibuprofen. Referral [facility] clinic. Cervical cultures HIV and RPR are pending Diagnosis dysmenorrhea Final diagnoses:  None        Doug Sou, MD 08/10/15 1352

## 2015-08-11 LAB — HIV ANTIBODY (ROUTINE TESTING W REFLEX): HIV SCREEN 4TH GENERATION: NONREACTIVE

## 2015-08-11 LAB — RPR: RPR: NONREACTIVE

## 2015-08-12 LAB — GC/CHLAMYDIA PROBE AMP (~~LOC~~) NOT AT ARMC
CHLAMYDIA, DNA PROBE: NEGATIVE
NEISSERIA GONORRHEA: NEGATIVE

## 2015-09-12 NOTE — ED Provider Notes (Signed)
Addendum  Physical exam from 08/10/15 encounter 0918 am: Vital signs nurses notes reviewed:  General alert, No distress: Eyes :eomi, conjucncivae pink Heent  No facial assymmetry Neck supple Cor normal rate Respirartory: no respiratory distress, Abdomen: non distended non tender GU: normal exteral genitalia , no cervical motion tenderness;no adnexal masses or tenderness Musculoskeletal:  All 4 extremities , neurovascularly intact Skin warm, dry Neuro : gcs 15 moves all extremities, cn2-12 grossly intact     Doug SouSam Samyak Sackmann, MD 09/12/15 1441

## 2015-12-27 ENCOUNTER — Emergency Department (HOSPITAL_COMMUNITY): Payer: Managed Care, Other (non HMO)

## 2015-12-27 ENCOUNTER — Encounter (HOSPITAL_COMMUNITY): Payer: Self-pay | Admitting: Emergency Medicine

## 2015-12-27 ENCOUNTER — Emergency Department (HOSPITAL_COMMUNITY)
Admission: EM | Admit: 2015-12-27 | Discharge: 2015-12-27 | Disposition: A | Discharge status: Home / Self Care | Payer: Managed Care, Other (non HMO) | Attending: Emergency Medicine | Admitting: Emergency Medicine

## 2015-12-27 DIAGNOSIS — J449 Chronic obstructive pulmonary disease, unspecified: Secondary | ICD-10-CM | POA: Insufficient documentation

## 2015-12-27 DIAGNOSIS — R05 Cough: Secondary | ICD-10-CM

## 2015-12-27 DIAGNOSIS — F17210 Nicotine dependence, cigarettes, uncomplicated: Secondary | ICD-10-CM | POA: Diagnosis not present

## 2015-12-27 DIAGNOSIS — Z79899 Other long term (current) drug therapy: Secondary | ICD-10-CM | POA: Insufficient documentation

## 2015-12-27 DIAGNOSIS — R059 Cough, unspecified: Secondary | ICD-10-CM

## 2015-12-27 DIAGNOSIS — J069 Acute upper respiratory infection, unspecified: Secondary | ICD-10-CM | POA: Diagnosis not present

## 2015-12-27 MED ORDER — BENZONATATE 100 MG PO CAPS: 100.0000 mg | ORAL_CAPSULE | Freq: Three times a day (TID) | ORAL | Status: DC

## 2015-12-27 NOTE — ED Notes (Signed)
Pt c/o cough x 4 weeks. Pt sts she coughs until she coughs up a "glob of clear spit." Pt c/o chest pain from the coughing. A&Ox4 and ambulatory. Denies N/V/D, fever, sore throat.

## 2015-12-27 NOTE — ED Provider Notes (Signed)
CSN: [id number]     Arrival date & time 12/27/15  1559 History  By signing my name below, I, Placido Sou, attest that this documentation has been prepared under the direction and in the presence of [employer], PA-C. Electronically Signed: Placido Sou, ED Scribe. 12/27/2015. 4:48 PM.    Chief Complaint  Patient presents with  . Cough   The history is provided by the patient. No language interpreter was used.    HPI Comments: Yolanda Hendricks is a 45 y.o. female with a PMHx of asthma, COPD and a hx of smoking 1/2 PPD who presents to the Emergency Department complaining of constant, moderate, productive cough with clear sputum onset 4 weeks ago. She notes worsening symptoms until she produces a large amount of clear sputum which alleviates her cough for a short period of time. Pt reports associated rhinorrhea and chest/sinus congestion. She has taken OTC cough medications with no significant relief. She denies fevers, chest pain, SOB or any other associated symptoms at this time.    Past Medical History  Diagnosis Date  . Asthma    Past Surgical History  Procedure Laterality Date  . Eye surgery    . Tubal ligation     No family history on file. Social History  Substance Use Topics  . Smoking status: Current Some Day Smoker -- 0.50 packs/day    Types: Cigarettes  . Smokeless tobacco: Never Used  . Alcohol Use: 0.6 oz/week    1 Cans of beer per week     Comment: 2-3 40 oz cans   OB History    No data available     Review of Systems A complete 10 system review of systems was obtained and all systems are negative except as noted in the HPI and PMH.   Allergies  Review of patient's allergies indicates no known allergies.  Home Medications   Prior to Admission medications   Medication Sig Start Date End Date Taking? Authorizing Provider  albuterol (PROVENTIL HFA;VENTOLIN HFA) 108 (90 BASE) MCG/ACT inhaler Inhale 2 puffs into the lungs every 4 (four) hours as needed  for wheezing or shortness of breath (cough). 07/22/15   Pincus Large, DO  ibuprofen (ADVIL,MOTRIN) 800 MG tablet Take 1 tablet (800 mg total) by mouth every 8 (eight) hours as needed for cramping. Take with food. 08/10/15   Doug Sou, MD   BP 141/98 mmHg  Pulse 84  Temp(Src) 98.3 F (36.8 C) (Oral)  Resp 16  SpO2 97%  LMP 12/06/2015    Physical Exam  Constitutional: She is oriented to person, place, and time. She appears well-developed and well-nourished.  HENT:  Head: Normocephalic and atraumatic.  Right Ear: Tympanic membrane and ear canal normal.  Left Ear: Tympanic membrane and ear canal normal.  Nose: Nose normal.  Mouth/Throat: Uvula is midline, oropharynx is clear and moist and mucous membranes are normal. No oropharyngeal exudate, posterior oropharyngeal edema, posterior oropharyngeal erythema or tonsillar abscesses.  Eyes: EOM are normal.  Neck: Normal range of motion.  Cardiovascular: Normal rate, regular rhythm and normal heart sounds.  Exam reveals no gallop and no friction rub.   No murmur heard. Pulmonary/Chest: Effort normal and breath sounds normal. No respiratory distress. She has no wheezes. She has no rales.  Abdominal: Soft.  Musculoskeletal: Normal range of motion.  Neurological: She is alert and oriented to person, place, and time.  Skin: Skin is warm and dry.  Psychiatric: She has a normal mood and affect.  Nursing note and  vitals reviewed.   ED Course  Procedures  DIAGNOSTIC STUDIES: Oxygen Saturation is 97% on RA, normal by my interpretation.    COORDINATION OF CARE: 4:46 PM Discussed next steps with pt including a CXR. She verbalized understanding and is agreeable with the plan.   Labs Review Labs Reviewed - No data to display  Imaging Review Dg Chest 2 View  12/27/2015  CLINICAL DATA:  45 year old smoker with current history of asthma presenting with a 4 week history of cough, associated with chest pain. EXAM: CHEST  2 VIEW COMPARISON:   07/22/2015 and earlier. FINDINGS: Cardiomediastinal silhouette unremarkable. Lungs clear. Bronchovascular markings normal. Pulmonary vascularity normal. No pneumothorax. No pleural effusions. Visualized bony thorax intact. No significant interval change. IMPRESSION: Normal examination. Electronically Signed   By: Hulan Saas M.D.   On: 12/27/2015 17:19   I have personally reviewed and evaluated these images as part of my medical decision-making.   EKG Interpretation None      MDM   Final diagnoses:  None  Pt CXR negative for acute infiltrate. Patients symptoms are consistent with URI, likely viral etiology. Discussed that antibiotics are not indicated for viral infections. Pt will be discharged with symptomatic treatment.  Verbalizes understanding and is agreeable with plan. Pt is hemodynamically stable & in NAD prior to dc.  I personally performed the services described in this documentation, which was scribed in my presence. The recorded information has been reviewed and is accurate.    Santiago Glad, PA-C 12/27/15 2232  Derwood Kaplan, MD 12/28/15 2136

## 2016-01-07 ENCOUNTER — Emergency Department (HOSPITAL_COMMUNITY)
Admission: EM | Admit: 2016-01-07 | Discharge: 2016-01-07 | Disposition: A | Discharge status: Home / Self Care | Payer: Managed Care, Other (non HMO) | Attending: Emergency Medicine | Admitting: Emergency Medicine

## 2016-01-07 ENCOUNTER — Encounter (HOSPITAL_COMMUNITY): Payer: Self-pay

## 2016-01-07 ENCOUNTER — Emergency Department (HOSPITAL_COMMUNITY): Payer: Managed Care, Other (non HMO)

## 2016-01-07 DIAGNOSIS — R0789 Other chest pain: Secondary | ICD-10-CM | POA: Insufficient documentation

## 2016-01-07 DIAGNOSIS — F17210 Nicotine dependence, cigarettes, uncomplicated: Secondary | ICD-10-CM | POA: Insufficient documentation

## 2016-01-07 DIAGNOSIS — Z79899 Other long term (current) drug therapy: Secondary | ICD-10-CM | POA: Insufficient documentation

## 2016-01-07 DIAGNOSIS — R05 Cough: Secondary | ICD-10-CM | POA: Diagnosis not present

## 2016-01-07 DIAGNOSIS — R059 Cough, unspecified: Secondary | ICD-10-CM

## 2016-01-07 DIAGNOSIS — R079 Chest pain, unspecified: Secondary | ICD-10-CM

## 2016-01-07 DIAGNOSIS — J441 Chronic obstructive pulmonary disease with (acute) exacerbation: Secondary | ICD-10-CM | POA: Insufficient documentation

## 2016-01-07 MED ORDER — IBUPROFEN 600 MG PO TABS: 600.0000 mg | ORAL_TABLET | Freq: Four times a day (QID) | ORAL | Status: DC | PRN

## 2016-01-07 MED ORDER — CYCLOBENZAPRINE HCL 10 MG PO TABS
10.0000 mg | ORAL_TABLET | Freq: Once | ORAL | Status: AC
  Administered 2016-01-07: 10 mg via ORAL
  Filled 2016-01-07: qty 1

## 2016-01-07 MED ORDER — CYCLOBENZAPRINE HCL 10 MG PO TABS: 10.0000 mg | ORAL_TABLET | Freq: Two times a day (BID) | ORAL | Status: DC | PRN

## 2016-01-07 NOTE — Discharge Instructions (Signed)
Take your medications as prescribed. I recommend continuing to take your prescription of Tessalon Perles as prescribed as needed for your cough. Refrain from smoking until your symptoms have completely improved. Continue drinking fluids at home to remain hydrated. I recommend refraining from doing any heavy lifting or repetitive movements that exacerbate her symptoms over the next few days. Please follow up with a primary care provider from the Resource Guide provided below in 3-4 days. Please return to the Emergency Department if symptoms worsen or new onset of fever, lightheadedness, dizziness, coughing up blood, difficulty breathing, wheezing, worsening chest pain, leg swelling.

## 2016-01-07 NOTE — ED Notes (Signed)
Per pt, seen a week ago for a cough with rib pain during coughing.  Pt states last night, she coughed and now pain with breathing/coughing is worse.  Felt pull in chest.  Pain on left side.  Pt with no fever.  Pt given cough meds on last visit.

## 2016-01-07 NOTE — ED Provider Notes (Signed)
CSN: [id number]     Arrival date & time 01/07/16  0711 History   First MD Initiated Contact with Patient 01/07/16 [phone]     Chief Complaint  Patient presents with  . Cough     (Consider location/radiation/quality/duration/timing/severity/associated sxs/prior Treatment) HPI   Patient is a 45 year old female past medical history of asthma, COPD who presents to the ED with complaint of left chest wall pain. Patient reports she has had a moderate productive cough with clear sputum for the past 3 weeks. She notes she was seen in the ED on 2/24, negative chest x-ray, diagnosed with viral URI and discharged home with antitussive. Patient reports her cough has improved over the past week with Tessalon. Patient states she has been taking ibuprofen for her chest pain and reports relief of pain. She notes this morning she was having a "hard and deep cough" she felt like she pulled a muscle in the left side of her chest wall and states since she has been having sharp pain to that area. She notes the pain is worse with movement, deep breathing or coughing. Patient reports having mild shortness of breath due to chest pain with breathing. Patient also reports since her cough has improved over the past week she feels like she has been "overdoing it at work". Patient reports doing heavy lifting at work. Patient also endorses nasal congestion and rhinorrhea which she notes has improved since her last visit to the ED. Denies fever, chills, headache, lightheadedness, dizziness, sore throat, palpitations, abdominal pain, nausea, vomiting, diarrhea, numbness, tingling, weakness. Denies prior history of DVT/PE, recent trauma or surgery, hemoptysis, or exogenous estrogen, leg swelling.   Past Medical History  Diagnosis Date  . Asthma    Past Surgical History  Procedure Laterality Date  . Eye surgery    . Tubal ligation     History reviewed. No pertinent family history. Social History  Substance Use Topics  .  Smoking status: Current Some Day Smoker -- 0.50 packs/day    Types: Cigarettes  . Smokeless tobacco: Never Used  . Alcohol Use: 0.6 oz/week    1 Cans of beer per week     Comment: 2-3 40 oz cans   OB History    No data available     Review of Systems  Respiratory: Positive for cough and shortness of breath.   Cardiovascular: Positive for chest pain.  All other systems reviewed and are negative.     Allergies  Review of patient's allergies indicates no known allergies.  Home Medications   Prior to Admission medications   Medication Sig Start Date End Date Taking? Authorizing Provider  albuterol (PROVENTIL HFA;VENTOLIN HFA) 108 (90 BASE) MCG/ACT inhaler Inhale 2 puffs into the lungs every 4 (four) hours as needed for wheezing or shortness of breath (cough). 07/22/15  Yes Pincus Large, DO  benzonatate (TESSALON) 100 MG capsule Take 1 capsule (100 mg total) by mouth every 8 (eight) hours. 12/27/15  Yes Heather Laisure, PA-C  cyclobenzaprine (FLEXERIL) 10 MG tablet Take 1 tablet (10 mg total) by mouth 2 (two) times daily as needed for muscle spasms. 01/07/16   Barrett Henle, PA-C  ibuprofen (ADVIL,MOTRIN) 600 MG tablet Take 1 tablet (600 mg total) by mouth every 6 (six) hours as needed. 01/07/16   Satira Sark Arnetta Odeh, PA-C   BP 120/85 mmHg  Pulse 101  Temp(Src) 98.4 F (36.9 C) (Oral)  Resp 20  SpO2 100%  LMP 12/06/2015 Physical Exam  Constitutional: She is oriented to  person, place, and time. She appears well-developed and well-nourished.  HENT:  Head: Normocephalic and atraumatic.  Mouth/Throat: Oropharynx is clear and moist. No oropharyngeal exudate.  Eyes: Conjunctivae and EOM are normal. Right eye exhibits no discharge. Left eye exhibits no discharge. No scleral icterus.  Neck: Normal range of motion. Neck supple.  Cardiovascular: Normal rate, regular rhythm, normal heart sounds and intact distal pulses.   HR 92  Pulmonary/Chest: Effort normal and breath  sounds normal. No respiratory distress. She has no wheezes. She has no rales. She exhibits tenderness (TTP over left anterior and posterior chest wall). She exhibits no crepitus, no deformity, no swelling and no retraction.  Abdominal: Soft. Bowel sounds are normal. She exhibits no distension and no mass. There is no tenderness. There is no rebound and no guarding.  Musculoskeletal: Normal range of motion. She exhibits no edema.  Lymphadenopathy:    She has no cervical adenopathy.  Neurological: She is alert and oriented to person, place, and time.  Skin: Skin is warm and dry.  Nursing note and vitals reviewed.   ED Course  Procedures (including critical care time) Labs Review Labs Reviewed - No data to display  Imaging Review Dg Chest 2 View  01/07/2016  CLINICAL DATA:  45 year old with worsening cough over the last week. Increasing chest pain and shortness of breath. Smoker. EXAM: CHEST  2 VIEW COMPARISON:  Radiographs 12/27/2015 and 07/22/2015. FINDINGS: The heart size and mediastinal contours are normal. The lungs are clear. There is no pleural effusion or pneumothorax. No acute osseous findings are identified. IMPRESSION: Stable chest.  No active cardiopulmonary process. Electronically Signed   By: Carey BullocksWilliam  Veazey M.D.   On: 01/07/2016 07:48   I have personally reviewed and evaluated these images and lab results as part of my medical decision-making.   EKG Interpretation None      MDM   Final diagnoses:  Cough  Chest pain, unspecified chest pain type    Patient presents with left chest pain, worse with coughing or movement. Endorses having a moderate productive cough for the past 3 weeks. Patient was seen in ED on 2/24, negative chest x-ray, diagnosed with viral URI and discharged home with antitussives. Patient reports feeling a pulled muscle in her left chest with deep coughing this morning. Patient reports her URI symptoms have been improving over the past week but notes her  chest pain worsen this morning with her deep cough.VSS. Exam revealed tenderness over left anterior and posterior chest wall, lungs CTAB, remaining exam unremarkable. PERC negative. Chest x-ray negative. I suspect patient's symptoms are likely due to musculoskeletal chest wall pain associated with viral URI. Plan to discharge patient home with symptomatic treatment. Advised patient to refrain from smoking while she is symptomatic. Patient given resource guide to follow up with PCP.  Evaluation does not show pathology requring ongoing emergent intervention or admission. Pt is hemodynamically stable and mentating appropriately. Discussed findings/results and plan with patient/guardian, who agrees with plan. All questions answered. Return precautions discussed and outpatient follow up given.      Satira Sarkicole Elizabeth La JuntaNadeau, [place] JerseyPA-C 01/07/16 [id number]  Marily MemosJason Mesner, MD 01/08/16 [phone]

## 2016-01-09 ENCOUNTER — Emergency Department (HOSPITAL_COMMUNITY)
Admission: EM | Admit: 2016-01-09 | Discharge: 2016-01-10 | Disposition: A | Discharge status: Home / Self Care | Payer: Managed Care, Other (non HMO) | Attending: Emergency Medicine | Admitting: Emergency Medicine

## 2016-01-09 DIAGNOSIS — Z79899 Other long term (current) drug therapy: Secondary | ICD-10-CM | POA: Diagnosis not present

## 2016-01-09 DIAGNOSIS — F17210 Nicotine dependence, cigarettes, uncomplicated: Secondary | ICD-10-CM | POA: Diagnosis not present

## 2016-01-09 DIAGNOSIS — J45909 Unspecified asthma, uncomplicated: Secondary | ICD-10-CM | POA: Insufficient documentation

## 2016-01-09 DIAGNOSIS — Z7689 Persons encountering health services in other specified circumstances: Secondary | ICD-10-CM

## 2016-01-09 DIAGNOSIS — Z0289 Encounter for other administrative examinations: Secondary | ICD-10-CM | POA: Diagnosis not present

## 2016-01-10 ENCOUNTER — Encounter (HOSPITAL_COMMUNITY): Payer: Self-pay

## 2016-01-10 NOTE — ED Notes (Signed)
Pt. Needs a work note so she can go back to work. Her boss saw her discharge instructions that warned against repetitive movements and now will not let her work. [id number] .

## 2016-01-10 NOTE — ED Provider Notes (Signed)
CSN: [id number]     Arrival date & time 01/09/16  2321 History   First MD Initiated Contact with Patient 01/10/16 0017     Chief Complaint  Patient presents with  . Work Note      (Consider location/radiation/quality/duration/timing/severity/associated sxs/prior Treatment) HPI Comments: 45 y/o female presents to the ED for a work note. She reports that she was put on restrictions to refrain from repetitive movements at work; however, there is no other job for her to do so her boss sent her home. She is here requesting a work no clearing her of all restrictions. No other complaints for this visit. No reports of worsening pain.  The history is provided by the patient. No language interpreter was used.    Past Medical History  Diagnosis Date  . Asthma    Past Surgical History  Procedure Laterality Date  . Eye surgery    . Tubal ligation     No family history on file. Social History  Substance Use Topics  . Smoking status: Current Some Day Smoker -- 0.50 packs/day    Types: Cigarettes  . Smokeless tobacco: Never Used  . Alcohol Use: 0.6 oz/week    1 Cans of beer per week     Comment: 2-3 40 oz cans   OB History    No data available      Review of Systems Ten systems reviewed and are negative for acute change, except as noted in the HPI.    Allergies  Review of patient's allergies indicates no known allergies.  Home Medications   Prior to Admission medications   Medication Sig Start Date End Date Taking? Authorizing Provider  albuterol (PROVENTIL HFA;VENTOLIN HFA) 108 (90 BASE) MCG/ACT inhaler Inhale 2 puffs into the lungs every 4 (four) hours as needed for wheezing or shortness of breath (cough). 07/22/15  Yes Pincus LargeJazma Y Phelps, DO  benzonatate (TESSALON) 100 MG capsule Take 1 capsule (100 mg total) by mouth every 8 (eight) hours. 12/27/15  Yes Heather Laisure, PA-C  cyclobenzaprine (FLEXERIL) 10 MG tablet Take 1 tablet (10 mg total) by mouth 2 (two) times daily as needed  for muscle spasms. 01/07/16  Yes Barrett HenleNicole Elizabeth Nadeau, PA-C  ibuprofen (ADVIL,MOTRIN) 600 MG tablet Take 1 tablet (600 mg total) by mouth every 6 (six) hours as needed. 01/07/16  Yes Satira SarkNicole Elizabeth Nadeau, PA-C   BP 153/102 mmHg  Pulse 78  Temp(Src) 98.2 F (36.8 C) (Oral)  Resp 20  SpO2 100%  LMP 12/06/2015   Physical Exam  Constitutional: She is oriented to person, place, and time. She appears well-developed and well-nourished. No distress.  HENT:  Head: Normocephalic and atraumatic.  Eyes: Conjunctivae and EOM are normal. No scleral icterus.  Neck: Normal range of motion.  Pulmonary/Chest: Effort normal. No respiratory distress.  Musculoskeletal: Normal range of motion.  Neurological: She is alert and oriented to person, place, and time.  Skin: Skin is warm and dry. No rash noted. She is not diaphoretic. No erythema. No pallor.  Psychiatric: She has a normal mood and affect. Her behavior is normal.  Nursing note and vitals reviewed.   ED Course  Procedures (including critical care time) Labs Review Labs Reviewed - No data to display  Imaging Review No results found.   I have personally reviewed and evaluated these images and lab results as part of my medical decision-making.   EKG Interpretation None      MDM   Final diagnoses:  Return to work exam  Patient presenting for work not clearing her to return to work. No other complaints. Patient well and nontoxic appearing. Stable for discharge.    Antony Madura, PA-C 01/10/16 0032  Cy Blamer, MD 01/10/16 [phone]

## 2016-01-10 NOTE — ED Notes (Signed)
Pt seen for a work note to return to work

## 2016-06-07 ENCOUNTER — Encounter (HOSPITAL_COMMUNITY): Payer: Self-pay | Admitting: Emergency Medicine

## 2016-06-07 ENCOUNTER — Emergency Department (HOSPITAL_COMMUNITY)
Admission: EM | Admit: 2016-06-07 | Discharge: 2016-06-07 | Disposition: A | Discharge status: Home / Self Care | Payer: Managed Care, Other (non HMO) | Attending: Physician Assistant | Admitting: Physician Assistant

## 2016-06-07 DIAGNOSIS — N939 Abnormal uterine and vaginal bleeding, unspecified: Secondary | ICD-10-CM | POA: Diagnosis not present

## 2016-06-07 DIAGNOSIS — Z79899 Other long term (current) drug therapy: Secondary | ICD-10-CM | POA: Insufficient documentation

## 2016-06-07 DIAGNOSIS — F17210 Nicotine dependence, cigarettes, uncomplicated: Secondary | ICD-10-CM | POA: Diagnosis not present

## 2016-06-07 DIAGNOSIS — J45909 Unspecified asthma, uncomplicated: Secondary | ICD-10-CM | POA: Diagnosis not present

## 2016-06-07 LAB — URINE MICROSCOPIC-ADD ON

## 2016-06-07 LAB — COMPREHENSIVE METABOLIC PANEL
ALBUMIN: 4.1 g/dL (ref 3.5–5.0)
ALK PHOS: 66 U/L (ref 38–126)
ALT: 10 U/L — ABNORMAL LOW (ref 14–54)
ANION GAP: 5 (ref 5–15)
AST: 16 U/L (ref 15–41)
BILIRUBIN TOTAL: 0.6 mg/dL (ref 0.3–1.2)
BUN: 10 mg/dL (ref 6–20)
CALCIUM: 9 mg/dL (ref 8.9–10.3)
CO2: 30 mmol/L (ref 22–32)
Chloride: 106 mmol/L (ref 101–111)
Creatinine, Ser: 0.71 mg/dL (ref 0.44–1.00)
GFR calc non Af Amer: >60 mL/min (ref >60)
Glucose, Bld: 189 mg/dL — ABNORMAL HIGH (ref 65–99)
POTASSIUM: 3.6 mmol/L (ref 3.5–5.1)
SODIUM: 141 mmol/L (ref 135–145)
TOTAL PROTEIN: 7.3 g/dL (ref 6.5–8.1)

## 2016-06-07 LAB — URINALYSIS, ROUTINE W REFLEX MICROSCOPIC
Bilirubin Urine: NEGATIVE
Glucose, UA: NEGATIVE mg/dL
Ketones, ur: NEGATIVE mg/dL
Leukocytes, UA: NEGATIVE
NITRITE: NEGATIVE
PH: 6 (ref 5.0–8.0)
Protein, ur: NEGATIVE mg/dL
SPECIFIC GRAVITY, URINE: 1.026 (ref 1.005–1.030)

## 2016-06-07 LAB — CBC
HEMATOCRIT: 40.4 % (ref 36.0–46.0)
HEMOGLOBIN: 13.3 g/dL (ref 12.0–15.0)
MCH: 30.7 pg (ref 26.0–34.0)
MCHC: 32.9 g/dL (ref 30.0–36.0)
MCV: 93.3 fL (ref 78.0–100.0)
Platelets: 277 10*3/uL (ref 150–400)
RBC: 4.33 MIL/uL (ref 3.87–5.11)
RDW: 13.2 % (ref 11.5–15.5)
WBC: 11.9 10*3/uL — ABNORMAL HIGH (ref 4.0–10.5)

## 2016-06-07 LAB — I-STAT BETA HCG BLOOD, ED (MC, WL, AP ONLY)

## 2016-06-07 LAB — LIPASE, BLOOD: Lipase: 26 U/L (ref 11–51)

## 2016-06-07 MED ORDER — OXYCODONE-ACETAMINOPHEN 5-325 MG PO TABS
1.0000 | ORAL_TABLET | Freq: Once | ORAL | Status: AC
  Administered 2016-06-07: 1 via ORAL
  Filled 2016-06-07: qty 1

## 2016-06-07 MED ORDER — IBUPROFEN 800 MG PO TABS: 800.0000 mg | ORAL_TABLET | Freq: Three times a day (TID) | ORAL | 0 refills | Status: DC

## 2016-06-07 NOTE — Discharge Instructions (Signed)
Please return with any concerns. °

## 2016-06-07 NOTE — ED Triage Notes (Signed)
Patient presents for vaginal bleeding x1 days. Reports menstrual cycle was 15 days late, came on yesterday and was heavier than normal. Reports 2-3 saturated pads q hour. C/o lower abdominal cramping. Denies fever, lightheadedness, dizziness.

## 2016-06-07 NOTE — ED Provider Notes (Signed)
[facility]-EMERGENCY DEPT Provider Note   CSN: [id number] Arrival date & time: 06/07/16  1403  First Provider Contact:  None       History   Chief Complaint Chief Complaint  Patient presents with  . Vaginal Bleeding  . Abdominal Pain    HPI Yolanda Hendricks is a 45 y.o. female.  HPI  Patient is a 45 year old female with vaginal bleeding. Patient is 15 late days late in her menses. Patient had then had heavier than usual bleeding. No discharge. Patient does not have any unprotected sex with any new partners. She had mild cramping associated with the bleeding that similar to her usual menses pain  She's not had dizziness lightheadedness or any other concerns.  Past Medical History:  Diagnosis Date  . Asthma     There are no active problems to display for this patient.   Past Surgical History:  Procedure Laterality Date  . EYE SURGERY    . TUBAL LIGATION      OB History    No data available       Home Medications    Prior to Admission medications   Medication Sig Start Date End Date Taking? Authorizing Provider  albuterol (PROVENTIL HFA;VENTOLIN HFA) 108 (90 Base) MCG/ACT inhaler Inhale 2 puffs into the lungs every 4 (four) hours as needed for wheezing or shortness of breath (and/or cough).   Yes Historical Provider, MD  benzonatate (TESSALON) 100 MG capsule Take 100 mg by mouth every 8 (eight) hours as needed for cough.   Yes Historical Provider, MD  ibuprofen (ADVIL,MOTRIN) 200 MG tablet Take 600-800 mg by mouth every 6 (six) hours as needed for headache or moderate pain.   Yes Historical Provider, MD  cyclobenzaprine (FLEXERIL) 10 MG tablet Take 1 tablet (10 mg total) by mouth 2 (two) times daily as needed for muscle spasms. Patient not taking: Reported on 06/07/2016 01/07/16   Barrett HenleNicole Elizabeth Nadeau, PA-C  ibuprofen (ADVIL,MOTRIN) 600 MG tablet Take 1 tablet (600 mg total) by mouth every 6 (six) hours as needed. Patient not taking: Reported on 06/07/2016 01/07/16   Barrett HenleNicole  Elizabeth Nadeau, PA-C    Family History No family history on file.  Social History Social History  Substance Use Topics  . Smoking status: Current Some Day Smoker    Packs/day: 0.50    Types: Cigarettes  . Smokeless tobacco: Never Used  . Alcohol use 0.6 oz/week    1 Cans of beer per week     Comment: 2-3 40 oz cans     Allergies   Review of patient's allergies indicates no known allergies.   Review of Systems Review of Systems  Constitutional: Negative for activity change.  Respiratory: Negative for shortness of breath.   Cardiovascular: Negative for chest pain.  Gastrointestinal: Negative for abdominal pain.  Genitourinary: Positive for menstrual problem.  All other systems reviewed and are negative.    Physical Exam Updated Vital Signs BP 124/67 (BP Location: Right Arm)   Pulse 74   Temp 98.2 F (36.8 C) (Oral)   Resp 18   Ht 5' 7.5" (1.715 m)   Wt 153 lb (69.4 kg)   SpO2 100%   BMI 23.61 kg/m   Physical Exam  Constitutional: She is oriented to person, place, and time. She appears well-developed and well-nourished.  HENT:  Head: Normocephalic and atraumatic.  Eyes: Right eye exhibits no discharge.  Cardiovascular: Normal rate.   Pulmonary/Chest: Effort normal.  Abdominal: There is no tenderness.  Genitourinary: Vagina normal. No  vaginal discharge found.  Genitourinary Comments: Small amount of bloo din vault.   Neurological: She is oriented to person, place, and time.  Skin: Skin is warm and dry. She is not diaphoretic.  Psychiatric: She has a normal mood and affect.  Nursing note and vitals reviewed.    ED Treatments / Results  Labs (all labs ordered are listed, but only abnormal results are displayed) Labs Reviewed  COMPREHENSIVE METABOLIC PANEL - Abnormal; Notable for the following:       Result Value   Glucose, Bld 189 (*)    ALT 10 (*)    All other components within normal limits  CBC - Abnormal; Notable for the following:    WBC 11.9  (*)    All other components within normal limits  URINALYSIS, ROUTINE W REFLEX MICROSCOPIC (NOT AT [facility]) - Abnormal; Notable for the following:    Hgb urine dipstick MODERATE (*)    All other components within normal limits  URINE MICROSCOPIC-ADD ON - Abnormal; Notable for the following:    Squamous Epithelial / LPF 0-5 (*)    Bacteria, UA FEW (*)    All other components within normal limits  LIPASE, BLOOD  I-STAT BETA HCG BLOOD, ED ([facility], [facility], [facility] ONLY)    EKG  EKG Interpretation None       Radiology No results found.  Procedures Procedures (including critical care time)  Medications Ordered in ED Medications - No data to display   Initial Impression / Assessment and Plan / ED Course  I have reviewed the triage vital signs and the nursing notes.  Pertinent labs & imaging results that were available during my care of the patient were reviewed by me and considered in my medical decision making (see chart for details).  Clinical Course   Pt is a 45 year old female presenting with a late period and had increased vaginal bleeding. She has her normal pain. I think could be start of menopause. We will have her track her peroids. Follow-up with her primary physician. Patient's vaginal exam was normal with small amount of blood. Hemoglobin is normal and normal physical exam and vital signs.    Final Clinical Impressions(s) / ED Diagnoses   Final diagnoses:  None    New Prescriptions New Prescriptions   No medications on file     Markeese Boyajian Randall An, MD 06/07/16 1756

## 2017-02-25 ENCOUNTER — Emergency Department (HOSPITAL_COMMUNITY): Payer: Managed Care, Other (non HMO)

## 2017-02-25 ENCOUNTER — Emergency Department (HOSPITAL_COMMUNITY)
Admission: EM | Admit: 2017-02-25 | Discharge: 2017-02-25 | Disposition: A | Discharge status: Home / Self Care | Payer: Managed Care, Other (non HMO) | Attending: Emergency Medicine | Admitting: Emergency Medicine

## 2017-02-25 ENCOUNTER — Encounter (HOSPITAL_COMMUNITY): Payer: Self-pay | Admitting: Emergency Medicine

## 2017-02-25 DIAGNOSIS — Z79899 Other long term (current) drug therapy: Secondary | ICD-10-CM | POA: Insufficient documentation

## 2017-02-25 DIAGNOSIS — J40 Bronchitis, not specified as acute or chronic: Secondary | ICD-10-CM | POA: Diagnosis not present

## 2017-02-25 DIAGNOSIS — F17210 Nicotine dependence, cigarettes, uncomplicated: Secondary | ICD-10-CM | POA: Insufficient documentation

## 2017-02-25 DIAGNOSIS — R0602 Shortness of breath: Secondary | ICD-10-CM

## 2017-02-25 LAB — BASIC METABOLIC PANEL
Anion gap: 8 (ref 5–15)
BUN: 10 mg/dL (ref 6–20)
CHLORIDE: 103 mmol/L (ref 101–111)
CO2: 25 mmol/L (ref 22–32)
Calcium: 9.2 mg/dL (ref 8.9–10.3)
Creatinine, Ser: 0.66 mg/dL (ref 0.44–1.00)
GFR calc Af Amer: >60 mL/min (ref >60)
GFR calc non Af Amer: >60 mL/min (ref >60)
Glucose, Bld: 244 mg/dL — ABNORMAL HIGH (ref 65–99)
POTASSIUM: 3.8 mmol/L (ref 3.5–5.1)
SODIUM: 136 mmol/L (ref 135–145)

## 2017-02-25 LAB — CBC
HEMATOCRIT: 39.5 % (ref 36.0–46.0)
Hemoglobin: 13.3 g/dL (ref 12.0–15.0)
MCH: 30.1 pg (ref 26.0–34.0)
MCHC: 33.7 g/dL (ref 30.0–36.0)
MCV: 89.4 fL (ref 78.0–100.0)
Platelets: 310 10*3/uL (ref 150–400)
RBC: 4.42 MIL/uL (ref 3.87–5.11)
RDW: 12.8 % (ref 11.5–15.5)
WBC: 8.8 10*3/uL (ref 4.0–10.5)

## 2017-02-25 LAB — I-STAT TROPONIN, ED: Troponin i, poc: 0 ng/mL (ref 0.00–0.08)

## 2017-02-25 LAB — D-DIMER, QUANTITATIVE (NOT AT ARMC)

## 2017-02-25 MED ORDER — ALBUTEROL SULFATE HFA 108 (90 BASE) MCG/ACT IN AERS
2.0000 | INHALATION_SPRAY | Freq: Once | RESPIRATORY_TRACT | Status: AC
  Administered 2017-02-25: 2 via RESPIRATORY_TRACT
  Filled 2017-02-25: qty 6.7

## 2017-02-25 MED ORDER — IPRATROPIUM-ALBUTEROL 0.5-2.5 (3) MG/3ML IN SOLN
3.0000 mL | Freq: Once | RESPIRATORY_TRACT | Status: DC
  Filled 2017-02-25: qty 3

## 2017-02-25 MED ORDER — PREDNISONE 20 MG PO TABS: 40.0000 mg | ORAL_TABLET | Freq: Every day | ORAL | 0 refills | Status: AC

## 2017-02-25 MED ORDER — AEROCHAMBER PLUS FLO-VU MEDIUM MISC
1.0000 | Freq: Once | Status: AC
  Administered 2017-02-25: 1
  Filled 2017-02-25: qty 1

## 2017-02-25 MED ORDER — PREDNISONE 20 MG PO TABS
60.0000 mg | ORAL_TABLET | Freq: Once | ORAL | Status: AC
  Administered 2017-02-25: 60 mg via ORAL
  Filled 2017-02-25: qty 3

## 2017-02-25 MED ORDER — AZITHROMYCIN 250 MG PO TABS
250.0000 mg | ORAL_TABLET | Freq: Every day | ORAL | 0 refills | Status: DC
Start: 2017-02-25 — End: 2019-03-27

## 2017-02-25 NOTE — ED Notes (Signed)
EKG given to EDP,Palumbo,MD., for review. 

## 2017-02-25 NOTE — Discharge Instructions (Signed)
-   Use your inhaler every 4-6 hours as needed - Start taking prednisone tomorrow. Your first dose was given in the ER today. - Take the antibiotic as prescribed - Take over-the-counter loratadine (Claritin), fexofenadine, or other "antihistamine" medication for your allergies; this will help with your nasal congestion and sore throat. Continue using flonase.

## 2017-02-25 NOTE — ED Provider Notes (Signed)
[facility]-EMERGENCY DEPT Provider Note   CSN: [id number] Arrival date & time: 02/25/17  1308     History   Chief Complaint Chief Complaint  Patient presents with  . Shortness of Breath  . Chest Pain    HPI Yolanda Hendricks is a 46 y.o. female.  HPI   46 yo F with PMHx of smoking who p/w SOB. Pt states that for the past 1-2 weeks, she has had persistent nasal congestion, rhinorrhea, and mild, non-productive cough. She has been wheezing as well. She went to work last night and developed significant SOB. It was worse with exertion but did not completely resolve with rest. She had mild associated sharp, positional CP. No alleviating factors. She smokes regularly but denies any known h/o asthma or COPD. No fevers. No chills. No LE swelling or calf TTP. No history of DVT or PE. No family h/o early CAD.                                                                                                                                  Past Medical History:  Diagnosis Date  . Asthma     There are no active problems to display for this patient.   Past Surgical History:  Procedure Laterality Date  . EYE SURGERY    . TUBAL LIGATION      OB History    No data available       Home Medications    Prior to Admission medications   Medication Sig Start Date End Date Taking? Authorizing Provider  acetaminophen (TYLENOL) 500 MG tablet Take 1,000 mg by mouth every 6 (six) hours as needed (pain, cramps).   Yes Historical Provider, MD  albuterol (PROVENTIL HFA;VENTOLIN HFA) 108 (90 Base) MCG/ACT inhaler Inhale 2 puffs into the lungs every 4 (four) hours as needed for wheezing or shortness of breath (and/or cough).   Yes Historical Provider, MD  ibuprofen (ADVIL,MOTRIN) 800 MG tablet Take 1 tablet (800 mg total) by mouth 3 (three) times daily. 06/07/16  Yes Courteney Lyn Mackuen, MD  azithromycin (ZITHROMAX) 250 MG tablet Take 1 tablet (250 mg total) by mouth daily. Take first 2 tablets together, then  1 every day until finished. 02/25/17   Shaune Pollack, MD  predniSONE (DELTASONE) 20 MG tablet Take 2 tablets (40 mg total) by mouth daily. 02/26/17 03/02/17  Shaune Pollack, MD    Family History Family History  Problem Relation Age of Onset  . Diabetes Other     Social History Social History  Substance Use Topics  . Smoking status: Current Some Day Smoker    Packs/day: 0.50    Types: Cigarettes  . Smokeless tobacco: Never Used  . Alcohol use Yes     Comment: occ     Allergies   Patient has no known allergies.   Review of Systems Review of Systems  Constitutional: Positive for fatigue.  HENT: Positive for congestion, rhinorrhea and sore  throat.   Respiratory: Positive for cough, chest tightness and shortness of breath.   All other systems reviewed and are negative.    Physical Exam Updated Vital Signs BP (!) 124/99 (BP Location: Left Arm)   Pulse 99   Temp 98.2 F (36.8 C) (Oral)   Resp 18   LMP 02/23/2017 (Exact Date)   SpO2 98%   Physical Exam  Constitutional: She is oriented to person, place, and time. She appears well-developed and well-nourished. No distress.  HENT:  Head: Normocephalic and atraumatic.  Eyes: Conjunctivae are normal.  Neck: Neck supple.  Cardiovascular: Normal rate, regular rhythm and normal heart sounds.  Exam reveals no friction rub.   No murmur heard. Pulmonary/Chest: Effort normal. Tachypnea noted. No respiratory distress. She has wheezes (mild wheezing with end expiration). She has no rales.  Abdominal: She exhibits no distension.  Musculoskeletal: She exhibits no edema.  Neurological: She is alert and oriented to person, place, and time. She exhibits normal muscle tone.  Skin: Skin is warm. Capillary refill takes less than 2 seconds.  Psychiatric: She has a normal mood and affect.  Nursing note and vitals reviewed.    ED Treatments / Results  Labs (all labs ordered are listed, but only abnormal results are displayed) Labs  Reviewed  BASIC METABOLIC PANEL - Abnormal; Notable for the following:       Result Value   Glucose, Bld 244 (*)    All other components within normal limits  CBC  D-DIMER, QUANTITATIVE (NOT AT [facility])  I-STAT TROPOININ, ED    EKG  EKG Interpretation  Date/Time:  Thursday February 25 2017 06:37:26 EDT Ventricular Rate:  86 PR Interval:    QRS Duration: 100 QT Interval:  361 QTC Calculation: 432 R Axis:   57 Text Interpretation:  Sinus rhythm RSR' in V1 or V2, right VCD or RVH Confirmed by [facility]  MD, APRIL ([id number]) on 02/25/2017 6:59:27 AM       Radiology Dg Chest 2 View  Result Date: 02/25/2017 CLINICAL DATA:  Chest pain and shortness of breath EXAM: CHEST  2 VIEW COMPARISON:  01/07/2016 FINDINGS: Normal heart size and mediastinal contours. No acute infiltrate or edema. No effusion or pneumothorax. No acute osseous findings. EKG leads create artifact over the chest. IMPRESSION: No active cardiopulmonary disease. Electronically Signed   By: Marnee Spring M.D.   On: 02/25/2017 07:08    Procedures Procedures (including critical care time)  Medications Ordered in ED Medications  albuterol (PROVENTIL HFA;VENTOLIN HFA) 108 (90 Base) MCG/ACT inhaler 2 puff (not administered)  AEROCHAMBER PLUS FLO-VU MEDIUM MISC 1 each (not administered)  predniSONE (DELTASONE) tablet 60 mg (not administered)     Initial Impression / Assessment and Plan / ED Course  I have reviewed the triage vital signs and the nursing notes.  Pertinent labs & imaging results that were available during my care of the patient were reviewed by me and considered in my medical decision making (see chart for details).     46 yo F with PMHx tobacco dependence here with SOB and wheezing, likely 2/2 viral URI/bronchitis versus mild COPD versus allergic rhinitis with PND. EKG non-ischemic, no cardiac risk factors beyond DM and I do not suspect ACS. Trop negative despite sx present >12 hours and unchanging - doubt  ACS. D-Dimer neg, doubt PE or dissection. Pt is satting well on RA and feels improved after albuterol. CXR is clear. Will d/c with Z-Pack, steroids, albuterol, and outpt follow-up. Encouraged fluids at home. She will  continue flonase and antihistamines at home.  Final Clinical Impressions(s) / ED Diagnoses   Final diagnoses:  Shortness of breath  Bronchitis    New Prescriptions New Prescriptions   AZITHROMYCIN (ZITHROMAX) 250 MG TABLET    Take 1 tablet (250 mg total) by mouth daily. Take first 2 tablets together, then 1 every day until finished.   PREDNISONE (DELTASONE) 20 MG TABLET    Take 2 tablets (40 mg total) by mouth daily.     Shaune Pollack, MD 02/25/17 [phone]

## 2017-02-25 NOTE — ED Triage Notes (Signed)
Pt states tonight at work she was short of breath  Pt states she has had it before but tonight it is worse and even when she sat down to rest she never felt like she was able to completely catch her breath  Pt states this morning she started having a sharp stabbing pain in the middle of her chest as if someone was sticking her with a needle  Pt states she has had a cough for the past 3-4 weeks

## 2017-10-27 ENCOUNTER — Emergency Department (HOSPITAL_COMMUNITY)
Admission: EM | Admit: 2017-10-27 | Discharge: 2017-10-27 | Disposition: A | Discharge status: Home / Self Care | Payer: Managed Care, Other (non HMO) | Attending: Emergency Medicine | Admitting: Emergency Medicine

## 2017-10-27 ENCOUNTER — Emergency Department (HOSPITAL_COMMUNITY): Payer: Managed Care, Other (non HMO)

## 2017-10-27 ENCOUNTER — Other Ambulatory Visit: Payer: Self-pay

## 2017-10-27 ENCOUNTER — Encounter (HOSPITAL_COMMUNITY): Payer: Self-pay | Admitting: Emergency Medicine

## 2017-10-27 DIAGNOSIS — S93401A Sprain of unspecified ligament of right ankle, initial encounter: Secondary | ICD-10-CM

## 2017-10-27 DIAGNOSIS — R600 Localized edema: Secondary | ICD-10-CM | POA: Insufficient documentation

## 2017-10-27 DIAGNOSIS — Y9389 Activity, other specified: Secondary | ICD-10-CM | POA: Insufficient documentation

## 2017-10-27 DIAGNOSIS — W130XXA Fall from, out of or through balcony, initial encounter: Secondary | ICD-10-CM | POA: Insufficient documentation

## 2017-10-27 DIAGNOSIS — J45909 Unspecified asthma, uncomplicated: Secondary | ICD-10-CM | POA: Insufficient documentation

## 2017-10-27 DIAGNOSIS — Z79899 Other long term (current) drug therapy: Secondary | ICD-10-CM | POA: Insufficient documentation

## 2017-10-27 DIAGNOSIS — F17210 Nicotine dependence, cigarettes, uncomplicated: Secondary | ICD-10-CM | POA: Insufficient documentation

## 2017-10-27 DIAGNOSIS — Y998 Other external cause status: Secondary | ICD-10-CM | POA: Insufficient documentation

## 2017-10-27 DIAGNOSIS — Y9289 Other specified places as the place of occurrence of the external cause: Secondary | ICD-10-CM | POA: Insufficient documentation

## 2017-10-27 MED ORDER — NAPROXEN 250 MG PO TABS: 500.0000 mg | ORAL_TABLET | Freq: Once | ORAL | Status: DC

## 2017-10-27 MED ORDER — NAPROXEN 500 MG PO TABS: 500.0000 mg | ORAL_TABLET | Freq: Two times a day (BID) | ORAL | 0 refills | Status: DC

## 2017-10-27 NOTE — ED Triage Notes (Signed)
Pt fell off balcony and injured her right foot and right ankle, swelling noted. Abrasion to left knee but no pain to knee with walking or movement. Denies hitting head.

## 2017-10-27 NOTE — Discharge Instructions (Addendum)
Please read attached information regarding your condition. Wear ankle brace and use crutches as needed. Gradually bear weight on your ankle to prevent any stiffness. Follow instructions for RICE therapy. Follow-up with orthopedist listed below for further evaluation. Return to ED for worsening pain, red hot or tender joint, fevers, numbness in legs, severe back pain.

## 2017-10-27 NOTE — ED Notes (Signed)
Medium sized ankle ASO applied, crutches given and return demonstration by patient.

## 2017-10-27 NOTE — ED Provider Notes (Signed)
[facility] Provider Note   CSN: [id number] Arrival date & time: 10/27/17  0900     History   Chief Complaint Chief Complaint  Patient presents with  . Fall  . Ankle Injury    HPI Fredric DineYashika Celli is a 46 y.o. female with a past medical history of asthma, who presents to ED for evaluation of right ankle pain, swelling and pain with ambulation after slipping off of a balcony yesterday.  She denies any head injury or loss of consciousness.  She states that she landed on her feet when the railing of the balcony broke off.  She was ambulatory after the accident but states that pain got a lot worse this morning.  She has not taking any medications to help with symptoms.  She states that the swelling has gotten worse, and she has pain with range of motion of the ankle.  She denies any previous fracture, dislocations or procedures in the area.  She denies any back pain, numbness in legs, loss of bladder function, vision changes.  HPI  Past Medical History:  Diagnosis Date  . Asthma     There are no active problems to display for this patient.   Past Surgical History:  Procedure Laterality Date  . EYE SURGERY    . TUBAL LIGATION      OB History    No data available       Home Medications    Prior to Admission medications   Medication Sig Start Date End Date Taking? Authorizing Provider  acetaminophen (TYLENOL) 500 MG tablet Take 1,000 mg by mouth every 6 (six) hours as needed (pain, cramps).    [provider]  albuterol (PROVENTIL HFA;VENTOLIN HFA) 108 (90 Base) MCG/ACT inhaler Inhale 2 puffs into the lungs every 4 (four) hours as needed for wheezing or shortness of breath (and/or cough).    [provider]  azithromycin (ZITHROMAX) 250 MG tablet Take 1 tablet (250 mg total) by mouth daily. Take first 2 tablets together, then 1 every day until finished. 02/25/17   Shaune PollackIsaacs, Cameron, MD  ibuprofen (ADVIL,MOTRIN) 800 MG  tablet Take 1 tablet (800 mg total) by mouth 3 (three) times daily. 06/07/16   Mackuen, Courteney Lyn, MD  naproxen (NAPROSYN) 500 MG tablet Take 1 tablet (500 mg total) by mouth 2 (two) times daily. 10/27/17   Dietrich PatesKhatri, Linet Brash, PA-C    Family History Family History  Problem Relation Age of Onset  . Diabetes Other     Social History Social History   Tobacco Use  . Smoking status: Current Some Day Smoker    Packs/day: 0.50    Types: Cigarettes  . Smokeless tobacco: Never Used  Substance Use Topics  . Alcohol use: Yes    Comment: occ  . Drug use: No     Allergies   Patient has no known allergies.   Review of Systems Review of Systems  Constitutional: Negative for chills and fever.  Eyes: Negative for photophobia.  Gastrointestinal: Negative for nausea and vomiting.  Musculoskeletal: Positive for arthralgias, gait problem and joint swelling. Negative for back pain, neck pain and neck stiffness.  Skin: Negative for rash.  Neurological: Negative for headaches.     Physical Exam Updated Vital Signs BP 133/88 (BP Location: Right Arm)   Pulse (!) 111   Temp 99.2 F (37.3 C) (Oral)   Resp 18   Ht 5\' 7"  (1.702 m)   Wt 67.1 kg (148 lb)   LMP 09/27/2017  SpO2 98%   BMI 23.18 kg/m   Physical Exam  Constitutional: She is oriented to person, place, and time. She appears well-developed and well-nourished. No distress.  HENT:  Head: Normocephalic and atraumatic.  Eyes: Conjunctivae and EOM are normal. No scleral icterus.  Neck: Normal range of motion.  Cardiovascular: Normal rate and regular rhythm.  Pulmonary/Chest: Effort normal and breath sounds normal. No respiratory distress.  Musculoskeletal: Normal range of motion. She exhibits edema and tenderness. She exhibits no deformity.  Able to perform full active and passive range of motion of right ankle.  There is tenderness to palpation in the area of the lateral malleolus and mild edema noted, patient reports pain worse  with inversion and eversion of the ankle.  2+ DP pulse noted bilaterally. No midline spinal tenderness present in lumbar, thoracic or cervical spine. No step-off palpated. No visible bruising, edema or temperature change noted. No objective signs of numbness present. No saddle anesthesia. 2+ DP pulses bilaterally. Sensation intact to light touch. Strength 5/5 in bilateral lower extremities.  Neurological: She is alert and oriented to person, place, and time. No cranial nerve deficit or sensory deficit. She exhibits normal muscle tone. Coordination normal.  Pupils reactive. No facial asymmetry noted. Cranial nerves appear grossly intact. Sensation intact to light touch on face, BUE and BLE.  Skin: No rash noted. She is not diaphoretic.  Superficial skin abrasion noted on left knee.  Psychiatric: She has a normal mood and affect.  Nursing note and vitals reviewed.    ED Treatments / Results  Labs (all labs ordered are listed, but only abnormal results are displayed) Labs Reviewed - No data to display  EKG  EKG Interpretation None       Radiology Dg Ankle Complete Right  Result Date: 10/27/2017 CLINICAL DATA:  Fall. EXAM: RIGHT ANKLE - COMPLETE 3+ VIEW COMPARISON:  No prior. FINDINGS: Diffuse mild soft tissue swelling. No evidence of fracture dislocation. IMPRESSION: Diffuse mild soft tissue swelling. No acute bony or joint abnormality identified. Electronically Signed   By: Maisie Fushomas  Register   On: 10/27/2017 10:14   Dg Foot Complete Right  Result Date: 10/27/2017 CLINICAL DATA:  Fall. EXAM: RIGHT FOOT COMPLETE - 3+ VIEW COMPARISON:  None. FINDINGS: No acute bony or joint abnormality. No evidence of fracture dislocation. IMPRESSION: No acute abnormality. Electronically Signed   By: Maisie Fushomas  Register   On: 10/27/2017 10:13    Procedures Procedures (including critical care time)  Medications Ordered in ED Medications  naproxen (NAPROSYN) tablet 500 mg (not administered)      Initial Impression / Assessment and Plan / ED Course  I have reviewed the triage vital signs and the nursing notes.  Pertinent labs & imaging results that were available during my care of the patient were reviewed by me and considered in my medical decision making (see chart for details).     Patient presents to ED for evaluation of right ankle pain after falling off of a balcony yesterday.  She denies any head injury, loss of consciousness, back pain, numbness in legs or urinary incontinence.  She is been ambulatory with pain since the incident occurred.  Has not taken any medications prior to arrival.  There is tenderness to palpation of the right ankle more so on the lateral malleolus.  She has pain with range of motion but able to perform full active and passive range of motion.  No previous fracture, dislocation or procedure in the area.  She has no deficits on  her neurological exam or sign of closed head injury and no spinal tenderness to palpation.  She is overall well-appearing.  X-rays of ankle and foot return as negative.  I suspect possible ligamentous injury or contusion.  Will give ankle ASO, anti-inflammatories and have her follow-up with orthopedist for further evaluation.  Patient requests crutches, which I think is reasonable based on her pain level but did encourage her to gradually bear weight as tolerated to prevent stiffness.  Patient appears stable for discharge at this time.  Strict return precautions given.  Final Clinical Impressions(s) / ED Diagnoses   Final diagnoses:  Sprain of right ankle, unspecified ligament, initial encounter    ED Discharge Orders        Ordered    naproxen (NAPROSYN) 500 MG tablet  2 times daily     10/27/17 1033     Portions of this note were generated with Dragon dictation software. Dictation errors may occur despite best attempts at proofreading.    Dietrich Pates, PA-C 10/27/17 1034    Lavera Guise, MD 10/27/17 [id number]

## 2018-11-09 DIAGNOSIS — G4700 Insomnia, unspecified: Secondary | ICD-10-CM | POA: Insufficient documentation

## 2019-03-23 ENCOUNTER — Other Ambulatory Visit: Payer: Self-pay

## 2019-03-23 ENCOUNTER — Encounter (HOSPITAL_COMMUNITY): Payer: Self-pay

## 2019-03-23 ENCOUNTER — Emergency Department (HOSPITAL_COMMUNITY)
Admission: EM | Admit: 2019-03-23 | Discharge: 2019-03-23 | Disposition: A | Discharge status: Other Acute Inpatient Hospital | Payer: HRSA Program | Attending: Emergency Medicine | Admitting: Emergency Medicine

## 2019-03-23 ENCOUNTER — Other Ambulatory Visit: Payer: Self-pay | Admitting: Behavioral Health

## 2019-03-23 ENCOUNTER — Inpatient Hospital Stay (HOSPITAL_COMMUNITY)
Admission: AD | Admit: 2019-03-23 | Discharge: 2019-03-27 | DRG: 885 | Disposition: A | Discharge status: Home / Self Care | Payer: No Typology Code available for payment source | Source: Intra-hospital | Attending: Psychiatry | Admitting: Psychiatry

## 2019-03-23 DIAGNOSIS — Z03818 Encounter for observation for suspected exposure to other biological agents ruled out: Secondary | ICD-10-CM | POA: Diagnosis not present

## 2019-03-23 DIAGNOSIS — Z23 Encounter for immunization: Secondary | ICD-10-CM | POA: Diagnosis not present

## 2019-03-23 DIAGNOSIS — Z590 Homelessness: Secondary | ICD-10-CM

## 2019-03-23 DIAGNOSIS — Z79899 Other long term (current) drug therapy: Secondary | ICD-10-CM | POA: Insufficient documentation

## 2019-03-23 DIAGNOSIS — Y908 Blood alcohol level of 240 mg/100 ml or more: Secondary | ICD-10-CM | POA: Diagnosis present

## 2019-03-23 DIAGNOSIS — F329 Major depressive disorder, single episode, unspecified: Secondary | ICD-10-CM | POA: Insufficient documentation

## 2019-03-23 DIAGNOSIS — F1210 Cannabis abuse, uncomplicated: Secondary | ICD-10-CM | POA: Diagnosis present

## 2019-03-23 DIAGNOSIS — F332 Major depressive disorder, recurrent severe without psychotic features: Secondary | ICD-10-CM | POA: Diagnosis not present

## 2019-03-23 DIAGNOSIS — F322 Major depressive disorder, single episode, severe without psychotic features: Secondary | ICD-10-CM | POA: Diagnosis present

## 2019-03-23 DIAGNOSIS — R45851 Suicidal ideations: Secondary | ICD-10-CM | POA: Diagnosis present

## 2019-03-23 DIAGNOSIS — F17210 Nicotine dependence, cigarettes, uncomplicated: Secondary | ICD-10-CM | POA: Diagnosis present

## 2019-03-23 DIAGNOSIS — J45909 Unspecified asthma, uncomplicated: Secondary | ICD-10-CM | POA: Diagnosis not present

## 2019-03-23 DIAGNOSIS — F10129 Alcohol abuse with intoxication, unspecified: Secondary | ICD-10-CM | POA: Diagnosis present

## 2019-03-23 DIAGNOSIS — Z833 Family history of diabetes mellitus: Secondary | ICD-10-CM | POA: Diagnosis not present

## 2019-03-23 DIAGNOSIS — Z91410 Personal history of adult physical and sexual abuse: Secondary | ICD-10-CM

## 2019-03-23 LAB — ETHANOL: Alcohol, Ethyl (B): 288 mg/dL — ABNORMAL HIGH (ref <10)

## 2019-03-23 LAB — COMPREHENSIVE METABOLIC PANEL
ALT: 19 U/L (ref 0–44)
AST: 33 U/L (ref 15–41)
Albumin: 3.9 g/dL (ref 3.5–5.0)
Alkaline Phosphatase: 61 U/L (ref 38–126)
Anion gap: 13 (ref 5–15)
BUN: 7 mg/dL (ref 6–20)
CO2: 23 mmol/L (ref 22–32)
Calcium: 9.2 mg/dL (ref 8.9–10.3)
Chloride: 105 mmol/L (ref 98–111)
Creatinine, Ser: 0.52 mg/dL (ref 0.44–1.00)
GFR calc Af Amer: >60 mL/min (ref >60)
GFR calc non Af Amer: >60 mL/min (ref >60)
Glucose, Bld: 241 mg/dL — ABNORMAL HIGH (ref 70–99)
Potassium: 3.1 mmol/L — ABNORMAL LOW (ref 3.5–5.1)
Sodium: 141 mmol/L (ref 135–145)
Total Bilirubin: 0.4 mg/dL (ref 0.3–1.2)
Total Protein: 7.2 g/dL (ref 6.5–8.1)

## 2019-03-23 LAB — RAPID URINE DRUG SCREEN, HOSP PERFORMED
Amphetamines: NOT DETECTED
Barbiturates: NOT DETECTED
Benzodiazepines: NOT DETECTED
Cocaine: NOT DETECTED
Opiates: NOT DETECTED
Tetrahydrocannabinol: POSITIVE — AB

## 2019-03-23 LAB — CBC WITH DIFFERENTIAL/PLATELET
Abs Immature Granulocytes: 0.03 10*3/uL (ref 0.00–0.07)
Basophils Absolute: 0.1 10*3/uL (ref 0.0–0.1)
Basophils Relative: 1 %
Eosinophils Absolute: 0.1 10*3/uL (ref 0.0–0.5)
Eosinophils Relative: 1 %
HCT: 43.2 % (ref 36.0–46.0)
Hemoglobin: 14.6 g/dL (ref 12.0–15.0)
Immature Granulocytes: 1 %
Lymphocytes Relative: 39 %
Lymphs Abs: 2.5 10*3/uL (ref 0.7–4.0)
MCH: 31.3 pg (ref 26.0–34.0)
MCHC: 33.8 g/dL (ref 30.0–36.0)
MCV: 92.5 fL (ref 80.0–100.0)
Monocytes Absolute: 0.4 10*3/uL (ref 0.1–1.0)
Monocytes Relative: 6 %
Neutro Abs: 3.4 10*3/uL (ref 1.7–7.7)
Neutrophils Relative %: 52 %
Platelets: 293 10*3/uL (ref 150–400)
RBC: 4.67 MIL/uL (ref 3.87–5.11)
RDW: 12.8 % (ref 11.5–15.5)
WBC: 6.5 10*3/uL (ref 4.0–10.5)
nRBC: 0 % (ref 0.0–0.2)

## 2019-03-23 LAB — I-STAT BETA HCG BLOOD, ED (MC, WL, AP ONLY): I-stat hCG, quantitative: <5 m[IU]/mL (ref <5)

## 2019-03-23 LAB — SARS CORONAVIRUS 2 BY RT PCR (HOSPITAL ORDER, PERFORMED IN ~~LOC~~ HOSPITAL LAB): SARS Coronavirus 2: NEGATIVE

## 2019-03-23 LAB — SALICYLATE LEVEL: Salicylate Lvl: <7 mg/dL (ref 2.8–30.0)

## 2019-03-23 LAB — ACETAMINOPHEN LEVEL: Acetaminophen (Tylenol), Serum: <10 ug/mL — ABNORMAL LOW (ref 10–30)

## 2019-03-23 MED ORDER — LORAZEPAM 2 MG/ML IJ SOLN: 0.0000 mg | Freq: Two times a day (BID) | INTRAMUSCULAR | Status: DC

## 2019-03-23 MED ORDER — TEMAZEPAM 15 MG PO CAPS
30.0000 mg | ORAL_CAPSULE | Freq: Every day | ORAL | Status: DC
  Administered 2019-03-23 – 2019-03-26 (×4): 30 mg via ORAL
  Filled 2019-03-23 (×4): qty 2

## 2019-03-23 MED ORDER — ONDANSETRON 4 MG PO TBDP
4.0000 mg | ORAL_TABLET | Freq: Four times a day (QID) | ORAL | Status: AC | PRN
  Administered 2019-03-23: 4 mg via ORAL
  Filled 2019-03-23: qty 1

## 2019-03-23 MED ORDER — FLUOXETINE HCL 20 MG PO CAPS
20.0000 mg | ORAL_CAPSULE | Freq: Every day | ORAL | Status: DC
  Administered 2019-03-23 – 2019-03-27 (×5): 20 mg via ORAL
  Filled 2019-03-23 (×3): qty 1
  Filled 2019-03-23: qty 7
  Filled 2019-03-23 (×4): qty 1

## 2019-03-23 MED ORDER — VITAMIN B-1 100 MG PO TABS
100.0000 mg | ORAL_TABLET | Freq: Every day | ORAL | Status: DC
  Administered 2019-03-23: 100 mg via ORAL
  Filled 2019-03-23: qty 1

## 2019-03-23 MED ORDER — HYDROXYZINE HCL 25 MG PO TABS
25.0000 mg | ORAL_TABLET | Freq: Four times a day (QID) | ORAL | Status: AC | PRN
  Administered 2019-03-23 – 2019-03-26 (×5): 25 mg via ORAL
  Filled 2019-03-23 (×5): qty 1

## 2019-03-23 MED ORDER — ADULT MULTIVITAMIN W/MINERALS CH
1.0000 | ORAL_TABLET | Freq: Every day | ORAL | Status: DC
  Administered 2019-03-23 – 2019-03-27 (×5): 1 via ORAL
  Filled 2019-03-23 (×8): qty 1

## 2019-03-23 MED ORDER — LORAZEPAM 1 MG PO TABS: 0.0000 mg | ORAL_TABLET | Freq: Two times a day (BID) | ORAL | Status: DC

## 2019-03-23 MED ORDER — LORAZEPAM 1 MG PO TABS: 1.0000 mg | ORAL_TABLET | Freq: Four times a day (QID) | ORAL | Status: AC | PRN

## 2019-03-23 MED ORDER — ALUM & MAG HYDROXIDE-SIMETH 200-200-20 MG/5ML PO SUSP: 30.0000 mL | ORAL | Status: DC | PRN

## 2019-03-23 MED ORDER — NICOTINE 21 MG/24HR TD PT24
21.0000 mg | MEDICATED_PATCH | Freq: Every day | TRANSDERMAL | Status: DC
  Administered 2019-03-24 – 2019-03-27 (×4): 21 mg via TRANSDERMAL
  Filled 2019-03-23 (×6): qty 1

## 2019-03-23 MED ORDER — PNEUMOCOCCAL VAC POLYVALENT 25 MCG/0.5ML IJ INJ
0.5000 mL | INJECTION | INTRAMUSCULAR | Status: AC
  Administered 2019-03-26: 0.5 mL via INTRAMUSCULAR

## 2019-03-23 MED ORDER — THIAMINE HCL 100 MG/ML IJ SOLN: 100.0000 mg | Freq: Once | INTRAMUSCULAR | Status: DC

## 2019-03-23 MED ORDER — ACETAMINOPHEN 325 MG PO TABS
650.0000 mg | ORAL_TABLET | Freq: Four times a day (QID) | ORAL | Status: DC | PRN
  Administered 2019-03-23: 650 mg via ORAL
  Filled 2019-03-23: qty 2

## 2019-03-23 MED ORDER — LOPERAMIDE HCL 2 MG PO CAPS: 2.0000 mg | ORAL_CAPSULE | ORAL | Status: AC | PRN

## 2019-03-23 MED ORDER — IBUPROFEN 400 MG PO TABS
400.0000 mg | ORAL_TABLET | Freq: Once | ORAL | Status: AC
Start: 2019-03-23 — End: 2019-03-23
  Administered 2019-03-23: 08:00:00 400 mg via ORAL
  Filled 2019-03-23: qty 1

## 2019-03-23 MED ORDER — NICOTINE 21 MG/24HR TD PT24
21.0000 mg | MEDICATED_PATCH | Freq: Once | TRANSDERMAL | Status: DC
  Administered 2019-03-23: 10:00:00 21 mg via TRANSDERMAL
  Filled 2019-03-23: qty 1

## 2019-03-23 MED ORDER — FOLIC ACID 1 MG PO TABS
1.0000 mg | ORAL_TABLET | Freq: Every day | ORAL | Status: DC
  Administered 2019-03-23 – 2019-03-27 (×5): 1 mg via ORAL
  Filled 2019-03-23 (×9): qty 1

## 2019-03-23 MED ORDER — VITAMIN B-1 100 MG PO TABS
100.0000 mg | ORAL_TABLET | Freq: Every day | ORAL | Status: DC
  Administered 2019-03-24 – 2019-03-27 (×4): 100 mg via ORAL
  Filled 2019-03-23 (×6): qty 1

## 2019-03-23 MED ORDER — LORAZEPAM 1 MG PO TABS
0.0000 mg | ORAL_TABLET | Freq: Four times a day (QID) | ORAL | Status: DC
  Administered 2019-03-23: 09:00:00 1 mg via ORAL
  Filled 2019-03-23: qty 2

## 2019-03-23 MED ORDER — ALBUTEROL SULFATE HFA 108 (90 BASE) MCG/ACT IN AERS: 1.0000 | INHALATION_SPRAY | Freq: Four times a day (QID) | RESPIRATORY_TRACT | Status: DC | PRN

## 2019-03-23 MED ORDER — LORAZEPAM 2 MG/ML IJ SOLN: 0.0000 mg | Freq: Four times a day (QID) | INTRAMUSCULAR | Status: DC

## 2019-03-23 MED ORDER — THIAMINE HCL 100 MG/ML IJ SOLN: 100.0000 mg | Freq: Every day | INTRAMUSCULAR | Status: DC

## 2019-03-23 MED ORDER — COMPLETENATE 29-1 MG PO CHEW
1.0000 | CHEWABLE_TABLET | Freq: Every day | ORAL | Status: DC
  Administered 2019-03-24: 1 via ORAL
  Filled 2019-03-23 (×2): qty 1

## 2019-03-23 NOTE — ED Notes (Signed)
Pt refer having headache and ask for medicine for it

## 2019-03-23 NOTE — ED Notes (Signed)
Ordered patient's lunch tray.  

## 2019-03-23 NOTE — ED Triage Notes (Signed)
Pt arrived via [employer]; pt voluntarily brought from hotel after warfare check by [employer] was called by friend relating to FB post that pt was unwanted and not happy with her life. Pt recently sold her personal belongings and went to [place] to find deceased mom's family with no success. Pt denies SI/HI and just says she needs help and wants to "be ok."

## 2019-03-23 NOTE — BH Assessment (Signed)
Pt recommended for IP psychiatric treatment. Pt accepted to [facility].- Accepting Denzil Magnuson NP. Pt attending - Landry Mellow MD. Pt must sign voluntary consent, and fax to [phone]. Original to accompany  patient. Pt may transport via [facility]. Bed available now, patient to arrive when transport can be arranged. RN to RN Report [phone]- report must be called prior to transport.
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
## 2019-03-23 NOTE — ED Notes (Signed)
Pt allowed to make phone call to friend Pt worried about her items in hotel room (where she has been staying) if she is not there by check out time today

## 2019-03-23 NOTE — ED Notes (Signed)
Meal tray received, and pt is eating breakfast

## 2019-03-23 NOTE — ED Notes (Signed)
Pt walked to bathroom, and came back to the room

## 2019-03-23 NOTE — ED Provider Notes (Signed)
[facility] EMERGENCY DEPARTMENT Provider Note   CSN: [id number] Arrival date & time: 03/23/19  0141    History   Chief Complaint Chief Complaint  Patient presents with  . Depression    HPI Yolanda Hendricks is a 48 y.o. female with a history of asthma who presents to the emergency department accompanied by police. Per police, patient was brought voluntarily from a hotel after a welfare check was performed by [employer]. A friend calle [employer] concerned because the patient was stating that she was unwanted and is not happy with her life on Facebook.  The patient is tearful and stating "why can't Life be easy. I can't go on like this."   She reports that 6 months ago that she sold all of her belongings and went to [place] to find her deceased mother's family.  She reports that the trip was unsuccessful and she then returned to [place], [place].  She reports that for the last few months that she was living with her stepsister, but was kicked out of her stepsisters home about a month ago because "she didn't want me around her boyfriend."  She denies previous hospitalizations for behavioral health.  She does report that she had a suicide attempt from overdosing on pills several years ago. When asked about SI, patient avoids the question, but becomes very tearful.  She denies HI or auditory visual hallucinations.  She is a current everyday tobacco smoker.  She reports that she drank 2 cups of vodka earlier tonight, but "hasn't had a drink in several hours."  She denies other IV or recreational drug use.   Denies other medical complaints including chest pain, shortness of breath, fever, chills, abdominal pain, nausea, vomiting, or diarrhea.     The history is provided by the patient. No language interpreter was used.    Past Medical History:  Diagnosis Date  . Asthma     There are no active problems to display for this patient.   Past Surgical History:  Procedure  Laterality Date  . EYE SURGERY    . TUBAL LIGATION       OB History   No obstetric history on file.      Home Medications    Prior to Admission medications   Medication Sig Start Date End Date Taking? Authorizing Provider  acetaminophen (TYLENOL) 500 MG tablet Take 1,000 mg by mouth every 6 (six) hours as needed (pain, cramps).    [provider]  albuterol (PROVENTIL HFA;VENTOLIN HFA) 108 (90 Base) MCG/ACT inhaler Inhale 2 puffs into the lungs every 4 (four) hours as needed for wheezing or shortness of breath (and/or cough).    [provider]  azithromycin (ZITHROMAX) 250 MG tablet Take 1 tablet (250 mg total) by mouth daily. Take first 2 tablets together, then 1 every day until finished. 02/25/17   Shaune Pollack, MD  ibuprofen (ADVIL,MOTRIN) 800 MG tablet Take 1 tablet (800 mg total) by mouth 3 (three) times daily. 06/07/16   Mackuen, Courteney Lyn, MD  naproxen (NAPROSYN) 500 MG tablet Take 1 tablet (500 mg total) by mouth 2 (two) times daily. 10/27/17   Dietrich Pates, PA-C    Family History Family History  Problem Relation Age of Onset  . Diabetes Other     Social History Social History   Tobacco Use  . Smoking status: Current Some Day Smoker    Packs/day: 0.50    Types: Cigarettes  . Smokeless tobacco: Never Used  Substance Use Topics  .  Alcohol use: Yes    Comment: occ  . Drug use: No     Allergies   Patient has no known allergies.   Review of Systems Review of Systems  Constitutional: Negative for activity change, chills and fever.  Eyes: Negative for visual disturbance.  Respiratory: Negative for shortness of breath.   Cardiovascular: Negative for chest pain.  Gastrointestinal: Negative for abdominal pain, diarrhea, nausea and vomiting.  Genitourinary: Negative for dysuria.  Musculoskeletal: Negative for back pain.  Skin: Negative for rash.  Allergic/Immunologic: Negative for immunocompromised state.  Neurological: Negative for  dizziness, syncope, weakness and headaches.  Psychiatric/Behavioral: Positive for dysphoric mood. Negative for confusion, hallucinations and self-injury.     Physical Exam Updated Vital Signs BP (!) 135/95 (BP Location: Right Arm)   Pulse 97   Temp 98 F (36.7 C) (Oral)   Resp 16   SpO2 97%   Physical Exam Vitals signs and nursing note reviewed.  Constitutional:      General: She is not in acute distress.    Appearance: She is not ill-appearing, toxic-appearing or diaphoretic.     Comments: Strong odor of ETOH.   HENT:     Head: Normocephalic.  Eyes:     Conjunctiva/sclera: Conjunctivae normal.     Comments: Eyes are glassy.   Neck:     Musculoskeletal: Neck supple.  Cardiovascular:     Rate and Rhythm: Normal rate and regular rhythm.     Heart sounds: No murmur. No friction rub. No gallop.   Pulmonary:     Effort: Pulmonary effort is normal. No respiratory distress.     Breath sounds: No stridor. No wheezing, rhonchi or rales.  Chest:     Chest wall: No tenderness.  Abdominal:     General: There is no distension.     Palpations: Abdomen is soft. There is no mass.     Tenderness: There is no abdominal tenderness. There is no right CVA tenderness, left CVA tenderness, guarding or rebound.     Hernia: No hernia is present.  Skin:    General: Skin is warm.     Findings: No rash.  Neurological:     Mental Status: She is alert.     Comments: Speech is slurred.   Psychiatric:        Attention and Perception: She does not perceive auditory hallucinations.        Mood and Affect: Affect is tearful.        Speech: Speech normal.        Behavior: Behavior normal.        Thought Content: Thought content is not paranoid or delusional. Thought content does not include homicidal ideation.      ED Treatments / Results  Labs (all labs ordered are listed, but only abnormal results are displayed) Labs Reviewed  COMPREHENSIVE METABOLIC PANEL - Abnormal; Notable for the  following components:      Result Value   Potassium 3.1 (*)    Glucose, Bld 241 (*)    All other components within normal limits  ETHANOL - Abnormal; Notable for the following components:   Alcohol, Ethyl (B) 288 (*)    All other components within normal limits  RAPID URINE DRUG SCREEN, HOSP PERFORMED - Abnormal; Notable for the following components:   Tetrahydrocannabinol POSITIVE (*)    All other components within normal limits  ACETAMINOPHEN LEVEL - Abnormal; Notable for the following components:   Acetaminophen (Tylenol), Serum <10 (*)    All other  components within normal limits  CBC WITH DIFFERENTIAL/PLATELET  SALICYLATE LEVEL  I-STAT BETA HCG BLOOD, ED ([facility], [facility], [facility] ONLY)    EKG None  Radiology No results found.  Procedures Procedures (including critical care time)  Medications Ordered in ED Medications - No data to display   Initial Impression / Assessment and Plan / ED Course  I have reviewed the triage vital signs and the nursing notes.  Pertinent labs & imaging results that were available during my care of the patient were reviewed by me and considered in my medical decision making (see chart for details).        48 year old female with a history of asthma brought in by police after a welfare check was performed due to the patient posting concerning messages on Facebook.  When asked about SI, the patient declines to answer.  She denies HI or auditory visual hallucinations. Patient is VOLUNTARY at this time.   UDS is positive for THC.  Ethanol level is 288.  She is mildly hypokalemic at 3.1.  Labs are otherwise unremarkable. Pt medically cleared at this time. Psych hold orders. No home med orders placed as patient has no home medications. TTS consulted and recommended AM psychiatric re-evaluation; please see psych team notes for further documentation of care/dispo. Pt stable at time of med clearance.    Final Clinical Impressions(s) / ED Diagnoses   Final  diagnoses:  Major depressive disorder with current active episode, unspecified depression episode severity, unspecified whether recurrent    ED Discharge Orders    None       Desta Bujak, Coral ElseMia A, PA-C 03/23/19 0653    Ward, Layla MawKristen N, DO 03/23/19 [id number]

## 2019-03-23 NOTE — ED Notes (Signed)
ED Provider at bedside. 

## 2019-03-23 NOTE — ED Notes (Signed)
TTS completed, monitor removed from bed

## 2019-03-23 NOTE — ED Notes (Signed)
Patient denies pain and is resting comfortably.  

## 2019-03-23 NOTE — ED Notes (Signed)
Breakfast tray ordered 

## 2019-03-23 NOTE — ED Provider Notes (Signed)
48 year old female presents today with suicidal ideation.  During rounds this morning she is in the bed in no acute distress.  She notes she has been drinking alcohol daily.  She has no perceivable signs of withdrawal with a calculated CIWA score of 3.  Patient awaiting TTS evaluation.  Vitals:   03/23/19 0204  BP: (!) 135/95  Pulse: 97  Resp: 16  Temp: 98 F (36.7 C)  SpO2: 97%     Eyvonne Mechanic, PA-C 03/23/19 0919    Blane Ohara, MD 03/23/19 1534

## 2019-03-23 NOTE — ED Notes (Signed)
TTS monitor at bedside 

## 2019-03-23 NOTE — BHH Suicide Risk Assessment (Signed)
[facility] Admission Suicide Risk Assessment   Total Time spent with patient: 45 minutes Principal Problem: <principal problem not specified> Diagnosis:  Active Problems:   MDD (major depressive disorder)  Subjective Data: MDD w ETOH abuse + SI  Continued Clinical Symptoms:    The "Alcohol Use Disorders Identification Test", Guidelines for Use in Primary Care, Second Edition.  World Science writer [facility]). Score between 0-7:  no or low risk or alcohol related problems. Score between 8-15:  moderate risk of alcohol related problems. Score between 16-19:  high risk of alcohol related problems. Score 20 or above:  warrants further diagnostic evaluation for alcohol dependence and treatment.   CLINICAL FACTORS:   Alcohol/Substance Abuse/Dependencies   Musculoskeletal: Strength & Muscle Tone: within normal limits Gait & Station: normal Patient leans: N/A  Psychiatric Specialty Exam: Physical Exam  ROS  There were no vitals taken for this visit.There is no height or weight on file to calculate BMI.  General Appearance: Disheveled  Eye Contact:  Fair  Speech:  Clear and Coherent  Volume:  Normal  Mood:  Depressed  Affect:  Congruent  Thought Process:  Linear and Descriptions of Associations: Intact  Orientation:  Full (Time, Place, and Person)  Thought Content:  Logical  Suicidal Thoughts:  Yes.  without intent/plan  Homicidal Thoughts:  No  Memory:  Immediate;   Fair  Judgement:  Good  Insight:  Good  Psychomotor Activity:  Normal  Concentration:  Concentration: Good  Recall:  Good  Fund of Knowledge:  Good  Language:  Good  Akathisia:  Negative  Handed:  Right  AIMS (if indicated):     Assets:  Leisure Time Physical Health Resilience  ADL's:  Intact  Cognition:  WNL  Sleep:         COGNITIVE FEATURES THAT CONTRIBUTE TO RISK:  None    SUICIDE RISK:   Mild:  Suicidal ideation of limited frequency, intensity, duration, and specificity.  There are no identifiable  plans, no associated intent, mild dysphoria and related symptoms, good self-control (both objective and subjective assessment), few other risk factors, and identifiable protective factors, including available and accessible social support.  PLAN OF CARE: see eval  I certify that inpatient services furnished can reasonably be expected to improve the patient's condition.   Malvin Johns, MD 03/23/2019, 4:25 PM

## 2019-03-23 NOTE — Tx Team (Signed)
Initial Treatment Plan 03/23/2019 6:36 PM Yolanda Hendricks UKG:[id number]    PATIENT STRESSORS: Financial difficulties Substance abuse   PATIENT STRENGTHS: Ability for insight Supportive family/friends   PATIENT IDENTIFIED PROBLEMS: "How do I stop feeling so sad."  Depression  Alcohol to cope                 DISCHARGE CRITERIA:  Ability to meet basic life and health needs Adequate post-discharge living arrangements Improved stabilization in mood, thinking, and/or behavior  PRELIMINARY DISCHARGE PLAN: Outpatient therapy  PATIENT/FAMILY INVOLVEMENT: This treatment plan has been presented to and reviewed with the patient, Yolanda Hendricks. The patient and family have been given the opportunity to ask questions and make suggestions.  Dewayne Shorter, RN 03/23/2019, 6:36 PM

## 2019-03-23 NOTE — ED Notes (Signed)
Pt being transferred to [facility]  Pt called BF to update  Pt currently calm cooperative

## 2019-03-23 NOTE — ED Notes (Addendum)
ED TO INPATIENT HANDOFF REPORT  ED Nurse Name and Phone #:   S Name/Age/Gender Yolanda Hendricks 48 y.o. female Room/Bed: 052C/052C  Code Status   Code Status: Full Code  Home/SNF/Other Home Patient oriented to: self, place, time and situation Is this baseline? Yes   Triage Complete: Triage complete  Chief Complaint voluntary  Triage Note Pt arrived via [employer]; pt voluntarily brought from hotel after warfare check by [employer] was called by friend relating to FB post that pt was unwanted and not happy with her life. Pt recently sold her personal belongings and went to [place] to find deceased mom's family with no success. Pt denies SI/HI and just says she needs help and wants to "be ok."    Allergies No Known Allergies  Level of Care/Admitting Diagnosis ED Disposition    None      B Medical/Surgery History Past Medical History:  Diagnosis Date  . Asthma    Past Surgical History:  Procedure Laterality Date  . EYE SURGERY    . TUBAL LIGATION       A IV Location/Drains/Wounds Patient Lines/Drains/Airways Status   Active Line/Drains/Airways    None          Intake/Output Last 24 hours No intake or output data in the 24 hours ending 03/23/19 1427  Labs/Imaging Results for orders placed or performed during the hospital encounter of 03/23/19 (from the past 48 hour(s))  Comprehensive metabolic panel     Status: Abnormal   Collection Time: 03/23/19  2:40 AM  Result Value Ref Range   Sodium 141 135 - 145 mmol/L   Potassium 3.1 (L) 3.5 - 5.1 mmol/L   Chloride 105 98 - 111 mmol/L   CO2 23 22 - 32 mmol/L   Glucose, Bld 241 (H) 70 - 99 mg/dL   BUN 7 6 - 20 mg/dL   Creatinine, Ser 4.09 0.44 - 1.00 mg/dL   Calcium 9.2 8.9 - 81.1 mg/dL   Total Protein 7.2 6.5 - 8.1 g/dL   Albumin 3.9 3.5 - 5.0 g/dL   AST 33 15 - 41 U/L   ALT 19 0 - 44 U/L   Alkaline Phosphatase 61 38 - 126 U/L   Total Bilirubin 0.4 0.3 - 1.2 mg/dL   GFR calc non Af Amer >60 >60 mL/min   GFR calc Af Amer  >60 >60 mL/min   Anion gap 13 5 - 15    Comment: Performed at [facility], [address] [place], [place] [address]  Ethanol     Status: Abnormal   Collection Time: 03/23/19  2:40 AM  Result Value Ref Range   Alcohol, Ethyl (B) 288 (H) <10 mg/dL    Comment: (NOTE) Lowest detectable limit for serum alcohol is 10 mg/dL. For medical purposes only. Performed at [facility], [address], [place], [place] [address]   Urine rapid drug screen (hosp performed)     Status: Abnormal   Collection Time: 03/23/19  2:40 AM  Result Value Ref Range   Opiates NONE DETECTED NONE DETECTED   Cocaine NONE DETECTED NONE DETECTED   Benzodiazepines NONE DETECTED NONE DETECTED   Amphetamines NONE DETECTED NONE DETECTED   Tetrahydrocannabinol POSITIVE (A) NONE DETECTED   Barbiturates NONE DETECTED NONE DETECTED    Comment: (NOTE) DRUG SCREEN FOR MEDICAL PURPOSES ONLY.  IF CONFIRMATION IS NEEDED FOR ANY PURPOSE, NOTIFY LAB WITHIN 5 DAYS. LOWEST DETECTABLE LIMITS FOR URINE DRUG SCREEN Drug Class  Cutoff (ng/mL) Amphetamine and metabolites    1000 Barbiturate and metabolites    200 Benzodiazepine                 200 Tricyclics and metabolites     300 Opiates and metabolites        300 Cocaine and metabolites        300 THC                            50 Performed at [facility], [address], [place], [place] [address]   CBC with Diff     Status: None   Collection Time: 03/23/19  2:40 AM  Result Value Ref Range   WBC 6.5 4.0 - 10.5 K/uL   RBC 4.67 3.87 - 5.11 MIL/uL   Hemoglobin 14.6 12.0 - 15.0 g/dL   HCT 76.1 60.7 - 37.1 %   MCV 92.5 80.0 - 100.0 fL   MCH 31.3 26.0 - 34.0 pg   MCHC 33.8 30.0 - 36.0 g/dL   RDW 06.2 69.4 - 85.4 %   Platelets 293 150 - 400 K/uL   nRBC 0.0 0.0 - 0.2 %   Neutrophils Relative % 52 %   Neutro Abs 3.4 1.7 - 7.7 K/uL   Lymphocytes Relative 39 %   Lymphs Abs 2.5 0.7 - 4.0 K/uL   Monocytes Relative 6 %   Monocytes  Absolute 0.4 0.1 - 1.0 K/uL   Eosinophils Relative 1 %   Eosinophils Absolute 0.1 0.0 - 0.5 K/uL   Basophils Relative 1 %   Basophils Absolute 0.1 0.0 - 0.1 K/uL   Immature Granulocytes 1 %   Abs Immature Granulocytes 0.03 0.00 - 0.07 K/uL    Comment: Performed at [facility], [address] [place], [place] [address]  Salicylate level     Status: None   Collection Time: 03/23/19  2:40 AM  Result Value Ref Range   Salicylate Lvl <7.0 2.8 - 30.0 mg/dL    Comment: Performed at [facility], [address] [place], [place] [address]  Acetaminophen level     Status: Abnormal   Collection Time: 03/23/19  2:40 AM  Result Value Ref Range   Acetaminophen (Tylenol), Serum <10 (L) 10 - 30 ug/mL    Comment: (NOTE) Therapeutic concentrations vary significantly. A range of 10-30 ug/mL  may be an effective concentration for many patients. However, some  are best treated at concentrations outside of this range. Acetaminophen concentrations >150 ug/mL at 4 hours after ingestion  and >50 ug/mL at 12 hours after ingestion are often associated with  toxic reactions. Performed at [facility], [address], [place], [place] [address]   I-Stat beta hCG blood, ED     Status: None   Collection Time: 03/23/19  2:50 AM  Result Value Ref Range   I-stat hCG, quantitative <5.0 <5 mIU/mL   Comment 3            Comment:   GEST. AGE      CONC.  (mIU/mL)   <=1 WEEK        5 - 50     2 WEEKS       50 - 500     3 WEEKS       100 - 10,000     4 WEEKS     1,000 - 30,000  FEMALE AND NON-PREGNANT FEMALE:     LESS THAN 5 mIU/mL   SARS Coronavirus 2 (CEPHEID - Performed in [facility] hospital lab), Hosp Order     Status: None   Collection Time: 03/23/19 11:22 AM  Result Value Ref Range   SARS Coronavirus 2 NEGATIVE NEGATIVE    Comment: (NOTE) If result is NEGATIVE SARS-CoV-2 target nucleic acids are NOT DETECTED. The SARS-CoV-2 RNA is generally detectable in upper and lower   respiratory specimens during the acute phase of infection. The lowest  concentration of SARS-CoV-2 viral copies this assay can detect is 250  copies / mL. A negative result does not preclude SARS-CoV-2 infection  and should not be used as the sole basis for treatment or other  patient management decisions.  A negative result may occur with  improper specimen collection / handling, submission of specimen other  than nasopharyngeal swab, presence of viral mutation(s) within the  areas targeted by this assay, and inadequate number of viral copies  (<250 copies / mL). A negative result must be combined with clinical  observations, patient history, and epidemiological information. If result is POSITIVE SARS-CoV-2 target nucleic acids are DETECTED. The SARS-CoV-2 RNA is generally detectable in upper and lower  respiratory specimens dur ing the acute phase of infection.  Positive  results are indicative of active infection with SARS-CoV-2.  Clinical  correlation with patient history and other diagnostic information is  necessary to determine patient infection status.  Positive results do  not rule out bacterial infection or co-infection with other viruses. If result is PRESUMPTIVE POSTIVE SARS-CoV-2 nucleic acids MAY BE PRESENT.   A presumptive positive result was obtained on the submitted specimen  and confirmed on repeat testing.  While 2019 novel coronavirus  (SARS-CoV-2) nucleic acids may be present in the submitted sample  additional confirmatory testing may be necessary for epidemiological  and / or clinical management purposes  to differentiate between  SARS-CoV-2 and other Sarbecovirus currently known to infect humans.  If clinically indicated additional testing with an alternate test  methodology [phone]([id number]) is advised. The SARS-CoV-2 RNA is generally  detectable in upper and lower respiratory sp ecimens during the acute  phase of infection. The expected result is Negative. Fact  Sheet for Patients:  BoilerBrush.com.cyhttps://www.fda.gov/media/136312/download Fact Sheet for Healthcare Providers: https://pope.com/https://www.fda.gov/media/136313/download This test is not yet approved or cleared by the [place] FDA and has been authorized for detection and/or diagnosis of SARS-CoV-2 by FDA under an Emergency Use Authorization (EUA).  This EUA will remain in effect (meaning this test can be used) for the duration of the COVID-19 declaration under Section 564(b)(1) of the Act, 21 U.S.C. section 360bbb-3(b)(1), unless the authorization is terminated or revoked sooner. Performed at [facility], [address] [place], [place] [address]    No results found.  Pending Labs Unresulted Labs (From admission, onward)   None      Vitals/Pain Today's Vitals   03/23/19 0203 03/23/19 0204 03/23/19 0802  BP:  (!) 135/95   Pulse:  97   Resp:  16   Temp:  98 F (36.7 C)   TempSrc:  Oral   SpO2:  97%   PainSc: 0-No pain  8     Isolation Precautions No active isolations  Medications Medications  LORazepam (ATIVAN) injection 0-4 mg ( Intravenous See Alternative 03/23/19 0928)    Or  LORazepam (ATIVAN) tablet 0-4 mg (1 mg Oral Given 03/23/19 0928)  LORazepam (ATIVAN) injection 0-4 mg (has no administration in  time range)    Or  LORazepam (ATIVAN) tablet 0-4 mg (has no administration in time range)  thiamine (VITAMIN B-1) tablet 100 mg (100 mg Oral Given 03/23/19 0928)    Or  thiamine (B-1) injection 100 mg ( Intravenous See Alternative 03/23/19 0928)  nicotine (NICODERM CQ - dosed in mg/24 hours) patch 21 mg (21 mg Transdermal Patch Applied 03/23/19 1014)  ibuprofen (ADVIL) tablet 400 mg (400 mg Oral Given 03/23/19 0805)    Mobility walks Low fall risk   Focused Assessments Psych   R Recommendations: See Admitting Provider Note  Report given to:   Additional Notes:  Pt currently homeless living in hotel ETOH upon arrival in ED 288 UDS positive for THC  Covid Negative  No  previous suicide attempts  Mother committed suicide   Most recent CIWA - 2 SI plan: "what ever I can get my hands on"  Pt currently pleasant calm and cooperative

## 2019-03-23 NOTE — Progress Notes (Signed)
D: Pt was in bed in her room upon initial approach.  Pt presents with anxious, depressed affect and mood.  Describes her day as "okay."  Forwards little information.  Writer and pt made goal for pt to be safe tonight.  Pt denies SI/HI, denies hallucinations, reports R shoulder pain of 7/10.  Pt has been isolative to her room for the majority of the evening.  Few interactions with peers.  A: Introduced self to pt.  Met with pt 1:1.  Actively listened to pt and offered support and encouragement.  Medication administered per order.  PRN medication administered for pain and anxiety.  Q15 minute safety checks in place.  Fall prevention techniques reviewed with pt and she verbalized understanding.   R: Pt is safe on the unit.  Pt is compliant with medications.  Pt verbally contracts for safety.  Will continue to monitor and assess.

## 2019-03-24 LAB — GLUCOSE, CAPILLARY
Glucose-Capillary: 369 mg/dL — ABNORMAL HIGH (ref 70–99)
Glucose-Capillary: 91 mg/dL (ref 70–99)
Glucose-Capillary: 216 mg/dL — ABNORMAL HIGH (ref 70–99)

## 2019-03-24 LAB — LIPID PANEL
Cholesterol: 165 mg/dL (ref 0–200)
HDL: 60 mg/dL (ref >40)
LDL Cholesterol: 51 mg/dL (ref 0–99)
Total CHOL/HDL Ratio: 2.8 RATIO
Triglycerides: 271 mg/dL — ABNORMAL HIGH (ref <150)
VLDL: 54 mg/dL — ABNORMAL HIGH (ref 0–40)

## 2019-03-24 LAB — TSH: TSH: 1.355 u[IU]/mL (ref 0.350–4.500)

## 2019-03-24 LAB — HEMOGLOBIN A1C
Hgb A1c MFr Bld: 10.7 % — ABNORMAL HIGH (ref 4.8–5.6)
Mean Plasma Glucose: 260.39 mg/dL

## 2019-03-24 MED ORDER — LIVING WELL WITH DIABETES BOOK
Freq: Once | Status: DC
  Filled 2019-03-24: qty 1

## 2019-03-24 MED ORDER — GABAPENTIN 300 MG PO CAPS
300.0000 mg | ORAL_CAPSULE | Freq: Three times a day (TID) | ORAL | Status: DC
  Administered 2019-03-24 – 2019-03-27 (×9): 300 mg via ORAL
  Filled 2019-03-24: qty 1
  Filled 2019-03-24: qty 21
  Filled 2019-03-24 (×6): qty 1
  Filled 2019-03-24: qty 21
  Filled 2019-03-24 (×3): qty 1
  Filled 2019-03-24: qty 21
  Filled 2019-03-24 (×2): qty 1

## 2019-03-24 MED ORDER — INSULIN ASPART 100 UNIT/ML ~~LOC~~ SOLN
0.0000 [IU] | Freq: Three times a day (TID) | SUBCUTANEOUS | Status: DC
  Administered 2019-03-25: 3 [IU] via SUBCUTANEOUS
  Administered 2019-03-25 (×2): 5 [IU] via SUBCUTANEOUS

## 2019-03-24 MED ORDER — PROPRANOLOL HCL 40 MG PO TABS
40.0000 mg | ORAL_TABLET | Freq: Two times a day (BID) | ORAL | Status: DC
  Administered 2019-03-24 – 2019-03-27 (×7): 40 mg via ORAL
  Filled 2019-03-24 (×2): qty 1
  Filled 2019-03-24: qty 14
  Filled 2019-03-24 (×3): qty 1
  Filled 2019-03-24: qty 14
  Filled 2019-03-24 (×4): qty 1

## 2019-03-24 MED ORDER — INSULIN ASPART 100 UNIT/ML ~~LOC~~ SOLN
10.0000 [IU] | Freq: Once | SUBCUTANEOUS | Status: AC
  Administered 2019-03-24: 10 [IU] via SUBCUTANEOUS

## 2019-03-24 NOTE — BHH Group Notes (Signed)
LCSW Group Therapy Note    Date/Time: 03/24/2019 @ 1:30pm Mood: Pleasant Type of Therapy and Topic: Group Therapy: Avoiding Self-Sabotaging and Enabling Behaviors Participation Level: Active Description of Group:  In this group, patients will learn how to identify obstacles, self-sabotaging and enabling behaviors, as well as: what are they, why do we do them and what needs these behaviors meet. Discuss unhealthy relationships and how to have positive healthy boundaries with those that sabotage and enable. Explore aspects of self-sabotage and enabling in yourself and how to limit these self-destructive behaviors in everyday life.   Therapeutic Goals: 1. Patient will identify one obstacle that relates to self-sabotage and enabling behaviors 2. Patient will identify one personal self-sabotaging or enabling behavior they did prior to admission 3. Patient will state a plan to change the above identified behavior 4. Patient will demonstrate ability to communicate their needs through discussion and/or role play.     Summary of Patient Progress:   Pt was active and engaged in group. Pt was able to identify personal obstacles that she is going through. Pt reports that her obstacle is her trying to get back on her feet. Pt reports that her self sabotaging and enabling behaviors is just not being on to move on from her past and that still interfering with her present. Pt was open about her past and opened up about needing a therapist so that she can process her feelings and discuss her trauma and past. Pt was supportive of her fellow group members.     Therapeutic Modalities:  Cognitive Behavioral Therapy Person-Centered Therapy Motivational Interviewing    [place], [place]

## 2019-03-24 NOTE — Tx Team (Signed)
Interdisciplinary Treatment and Diagnostic Plan Update  03/24/2019 Time of Session: 9:04am Yolanda Hendricks MRN: [id number]  Principal Diagnosis: <principal problem not specified>  Secondary Diagnoses: Active Problems:   MDD (major depressive disorder)   Current Medications:  Current Facility-Administered Medications  Medication Dose Route Frequency Provider Last Rate Last Dose  . acetaminophen (TYLENOL) tablet 650 mg  650 mg Oral Q6H PRN Sharma Covert, MD   650 mg at 03/23/19 2114  . albuterol (VENTOLIN HFA) 108 (90 Base) MCG/ACT inhaler 1-2 puff  1-2 puff Inhalation Q6H PRN Sharma Covert, MD      . alum & mag hydroxide-simeth (MAALOX/MYLANTA) 200-200-20 MG/5ML suspension 30 mL  30 mL Oral Q4H PRN Sharma Covert, MD      . FLUoxetine (PROZAC) capsule 20 mg  20 mg Oral Daily Johnn Hai, MD   20 mg at 03/24/19 0813  . folic acid (FOLVITE) tablet 1 mg  1 mg Oral Daily Sharma Covert, MD   1 mg at 03/24/19 0813  . gabapentin (NEURONTIN) capsule 300 mg  300 mg Oral TID Johnn Hai, MD      . hydrOXYzine (ATARAX/VISTARIL) tablet 25 mg  25 mg Oral Q6H PRN Sharma Covert, MD   25 mg at 03/23/19 2234  . loperamide (IMODIUM) capsule 2-4 mg  2-4 mg Oral PRN Sharma Covert, MD      . LORazepam (ATIVAN) tablet 1 mg  1 mg Oral Q6H PRN Sharma Covert, MD      . multivitamin with minerals tablet 1 tablet  1 tablet Oral Daily Sharma Covert, MD   1 tablet at 03/24/19 0813  . nicotine (NICODERM CQ - dosed in mg/24 hours) patch 21 mg  21 mg Transdermal Daily Johnn Hai, MD   21 mg at 03/24/19 0813  . ondansetron (ZOFRAN-ODT) disintegrating tablet 4 mg  4 mg Oral Q6H PRN Sharma Covert, MD   4 mg at 03/23/19 1807  . pneumococcal 23 valent vaccine (PNU-IMMUNE) injection 0.5 mL  0.5 mL Intramuscular Tomorrow-1000 Johnn Hai, MD      . prenatal vitamin w/FE, FA (NATACHEW) chewable tablet 1 tablet  1 tablet Oral Q1200 Johnn Hai, MD      . propranolol (INDERAL)  tablet 40 mg  40 mg Oral BID Johnn Hai, MD      . temazepam (RESTORIL) capsule 30 mg  30 mg Oral QHS Johnn Hai, MD   30 mg at 03/23/19 2114  . thiamine (B-1) injection 100 mg  100 mg Intramuscular Once Sharma Covert, MD      . thiamine (VITAMIN B-1) tablet 100 mg  100 mg Oral Daily Sharma Covert, MD   100 mg at 03/24/19 0813   PTA Medications: Medications Prior to Admission  Medication Sig Dispense Refill Last Dose  . azithromycin (ZITHROMAX) 250 MG tablet Take 1 tablet (250 mg total) by mouth daily. Take first 2 tablets together, then 1 every day until finished. (Patient not taking: Reported on 03/23/2019) 6 tablet 0 Not Taking at Unknown time  . ibuprofen (ADVIL,MOTRIN) 800 MG tablet Take 1 tablet (800 mg total) by mouth 3 (three) times daily. (Patient not taking: Reported on 03/23/2019) 21 tablet 0 Not Taking at Unknown time  . naproxen (NAPROSYN) 500 MG tablet Take 1 tablet (500 mg total) by mouth 2 (two) times daily. (Patient not taking: Reported on 03/23/2019) 30 tablet 0 Not Taking at Unknown time    Patient Stressors: Financial difficulties Substance abuse  Patient Strengths:  Ability for insight Supportive family/friends  Treatment Modalities: Medication Management, Group therapy, Case management,  1 to 1 session with clinician, Psychoeducation, Recreational therapy.   Physician Treatment Plan for Primary Diagnosis: <principal problem not specified> Long Term Goal(s):     Short Term Goals:    Medication Management: Evaluate patient's response, side effects, and tolerance of medication regimen.  Therapeutic Interventions: 1 to 1 sessions, Unit Group sessions and Medication administration.  Evaluation of Outcomes: Not Met  Physician Treatment Plan for Secondary Diagnosis: Active Problems:   MDD (major depressive disorder)  Long Term Goal(s):     Short Term Goals:       Medication Management: Evaluate patient's response, side effects, and tolerance of  medication regimen.  Therapeutic Interventions: 1 to 1 sessions, Unit Group sessions and Medication administration.  Evaluation of Outcomes: Not Met   RN Treatment Plan for Primary Diagnosis: <principal problem not specified> Long Term Goal(s): Knowledge of disease and therapeutic regimen to maintain health will improve  Short Term Goals: Ability to participate in decision making will improve, Ability to verbalize feelings will improve, Ability to disclose and discuss suicidal ideas, Ability to identify and develop effective coping behaviors will improve and Compliance with prescribed medications will improve  Medication Management: RN will administer medications as ordered by provider, will assess and evaluate patient's response and provide education to patient for prescribed medication. RN will report any adverse and/or side effects to prescribing provider.  Therapeutic Interventions: 1 on 1 counseling sessions, Psychoeducation, Medication administration, Evaluate responses to treatment, Monitor vital signs and CBGs as ordered, Perform/monitor CIWA, COWS, AIMS and Fall Risk screenings as ordered, Perform wound care treatments as ordered.  Evaluation of Outcomes: Not Met   LCSW Treatment Plan for Primary Diagnosis: <principal problem not specified> Long Term Goal(s): Safe transition to appropriate next level of care at discharge, Engage patient in therapeutic group addressing interpersonal concerns.  Short Term Goals: Engage patient in aftercare planning with referrals and resources and Increase skills for wellness and recovery  Therapeutic Interventions: Assess for all discharge needs, 1 to 1 time with Social worker, Explore available resources and support systems, Assess for adequacy in community support network, Educate family and significant other(s) on suicide prevention, Complete Psychosocial Assessment, Interpersonal group therapy.  Evaluation of Outcomes: Not Met   Progress in  Treatment: Attending groups: No. Participating in groups: No. Taking medication as prescribed: Yes. Toleration medication: Yes. Family/Significant other contact made: No, will contact:  will contact if given consent to contact Patient understands diagnosis: Yes. Discussing patient identified problems/goals with staff: Yes. Medical problems stabilized or resolved: Yes. Denies suicidal/homicidal ideation: Yes. Issues/concerns per patient self-inventory: No. Other:   New problem(s) identified: No, Describe:  None  New Short Term/Long Term Goal(s):  Patient Goals:  "To not feel sad, emotional, and hopeless"  Discharge Plan or Barriers:   Reason for Continuation of Hospitalization: Anxiety Depression Medication stabilization  Estimated Length of Stay: 3-5 days  Attendees: Patient: 03/24/2019  Physician: Dr. Johnn Hai, MD 03/24/2019   Nursing: Chong Sicilian, RN 03/24/2019  RN Care Manager: 03/24/2019   Social Worker: Ardelle Anton, LCSW 03/24/2019   Recreational Therapist:  03/24/2019   Other:  03/24/2019   Other:  03/24/2019   Other: 03/24/2019      Scribe for Treatment Team: Trecia Rogers, LCSW 03/24/2019 9:47 AM
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
## 2019-03-24 NOTE — Progress Notes (Signed)
D Pt is observed standing in the 300 hall dayroom. She is unkempt, she wears hospital-issue patient scrubs. She is flat, depressed and quite sad. She does engage in conversation with this Clinical research associate, asks approrpaite questions and is willing to listen to writer's questions.     A SHe completed her daily assessment and on this she wrote she denied SI today and she rated her depression, hopelessness and anxiety " 7/8/8/", respectively. This afternoon, after lunch, she said she wasn't" feeling quite right, that he vision was suddenly blurry" and parallled this feeling to times in the past when she has felt similiarly, and checked her blood sugar and " iot was high". vo obtained and cbg = 369. Per Dr Pershing Proud, pt given 10 units novolog insulin sub q.     R Safety in place.

## 2019-03-24 NOTE — Progress Notes (Signed)
   03/24/19 2220  COVID-19 Daily Checkoff  Have you had a fever (temp > 37.80C/100F)  in the past 24 hours?  No  COVID-19 EXPOSURE  Have you traveled outside the state in the past 14 days? No  Have you been in contact with someone with a confirmed diagnosis of COVID-19 or PUI in the past 14 days without wearing appropriate PPE? No  Have you been living in the same home as a person with confirmed diagnosis of COVID-19 or a PUI (household contact)? No  Have you been diagnosed with COVID-19? No

## 2019-03-24 NOTE — Progress Notes (Signed)
   03/24/19 0312  COVID-19 Daily Checkoff  Have you had a fever (temp > 37.80C/100F)  in the past 24 hours?  No  COVID-19 EXPOSURE  Have you traveled outside the state in the past 14 days? No  Have you been in contact with someone with a confirmed diagnosis of COVID-19 or PUI in the past 14 days without wearing appropriate PPE? No  Have you been living in the same home as a person with confirmed diagnosis of COVID-19 or a PUI (household contact)? No  Have you been diagnosed with COVID-19? No

## 2019-03-24 NOTE — Progress Notes (Signed)
D: Pt was in dayroom upon initial approach.  Pt presents with depressed, anxious affect and mood.  She brightens with interaction.  Pt reports her day was "good" and her goal was to "take it easy and be okay."  She reports the best part of her day was "doing the puzzles" with peer on the hall.  Pt denies SI/HI, denies hallucinations, denies pain.  Pt has been visible in milieu interacting with peers and staff appropriately.    A: Met with pt 1:1.  Actively listened to pt and offered support and encouragement. Medication administered per order.  PRN medication administered for anxiety per request.  15 minute safety checks in place.  R: Pt is safe on the unit.  Pt is compliant with medications.  Pt verbally contracts for safety.  Will continue to monitor and assess.

## 2019-03-24 NOTE — Plan of Care (Signed)
  Problem: Education: Goal: Knowledge of [facility] Education information/materials will improve Outcome: Not Progressing Goal: Emotional status will improve Outcome: Not Progressing   

## 2019-03-24 NOTE — Progress Notes (Signed)
Recreation Therapy Notes  Date:  5.22.20 Time: 0930 Location: 300 Hall Dayroom  Group Topic: Stress Management  Goal Area(s) Addresses:  Patient will identify positive stress management techniques. Patient will identify benefits of using stress management post d/c.  Intervention:  Stress Management  Activity :  Progressive Muscle Relaxation.  LRT introduced the stress management technique of pmr.  LRT read a script to lead patients through tensing then relaxation each muscle individually.  Patients were to follow along as script was read to engage in activity.  Education:  Stress Management, Discharge Planning.   Education Outcome: Acknowledges Education  Clinical Observations/Feedback:  Pt did not attend group.    Caroll Rancher, LRT/CTRS         Lillia Abed, Audry Pecina A 03/24/2019 11:02 AM
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
## 2019-03-24 NOTE — Progress Notes (Signed)
[place] NOVEL CORONAVIRUS (COVID-19) DAILY CHECK-OFF SYMPTOMS - answer yes or no to each - every day NO YES  Have you had a fever in the past 24 hours?  . Fever (Temp > 37.80C / 100F) X   Have you had any of these symptoms in the past 24 hours? . New Cough .  Sore Throat  .  Shortness of Breath .  Difficulty Breathing .  Unexplained Body Aches   X   Have you had any one of these symptoms in the past 24 hours not related to allergies?   . Runny Nose .  Nasal Congestion .  Sneezing   X   If you have had runny nose, nasal congestion, sneezing in the past 24 hours, has it worsened?  X   EXPOSURES - check yes or no X   Have you traveled outside the state in the past 14 days?  X   Have you been in contact with someone with a confirmed diagnosis of COVID-19 or PUI in the past 14 days without wearing appropriate PPE?  X   Have you been living in the same home as a person with confirmed diagnosis of COVID-19 or a PUI (household contact)?    X   Have you been diagnosed with COVID-19?    X              What to do next: Answered NO to all: Answered YES to anything:   Proceed with unit schedule Follow the [facility] Inpatient Flowsheet.   

## 2019-03-24 NOTE — Progress Notes (Signed)
Inpatient Diabetes Program Recommendations  AACE/ADA: New Consensus Statement on Inpatient Glycemic Control (2015)  Target Ranges:  Prepandial:   less than 140 mg/dL      Peak postprandial:   less than 180 mg/dL (1-2 hours)      Critically ill patients:  140 - 180 mg/dL   Lab Results  Component Value Date   GLUCAP 369 (H) 03/24/2019   HGBA1C 10.7 (H) 03/24/2019    Review of Glycemic Control  Diabetes history: DM 2, New diagnosis this admission  Current orders for Inpatient glycemic control: None  Inpatient Diabetes Program Recommendations:    Consider Levemir 14 units Q24 hours Novolog 0-15 units tid + Novolog 0-5 units qhs.   Patient will CM referral for PCP follow up and medication assistance with her insulin at time of d/c. Patient maybe able to get get into one of our clinics and get on pay tier for insulin and/or patient may need to go to [facility] and get her insulin there for $25/vial.  RN staff will need to assist patient in learning how to draw up and administer insulin via vial and syringe in addition to checking her glucose prior to d/c.  Will order the living well with DM booklet. Hopefully patient will get tomorrow.  Called RN @1630  pm. Discussed plan of care. Instructing pt on insulin administration and CBG checks prior to d/c.   Spoke with patient about her glucose levels, and current A1c level, 10.7%. Discussed glucose and A1c goals. Discussed plan of care with insulin injections and CBG checks before she is d/c'd.   Spoke with patient about PCP follow up and [place] working on her d/c plan for affordable options with her insulin and supplies.  Will call patient again Sunday/Monday to follow up on Education and questions in addition to d/c planning.  Thanks,  Christena Deem RN, MSN, BC-ADM Inpatient Diabetes Coordinator Team Pager [phone] (8a-5p)

## 2019-03-25 LAB — GLUCOSE, CAPILLARY
Glucose-Capillary: 242 mg/dL — ABNORMAL HIGH (ref 70–99)
Glucose-Capillary: 299 mg/dL — ABNORMAL HIGH (ref 70–99)
Glucose-Capillary: 299 mg/dL — ABNORMAL HIGH (ref 70–99)
Glucose-Capillary: 203 mg/dL — ABNORMAL HIGH (ref 70–99)

## 2019-03-25 MED ORDER — INSULIN ASPART 100 UNIT/ML ~~LOC~~ SOLN
0.0000 [IU] | Freq: Three times a day (TID) | SUBCUTANEOUS | Status: DC
  Administered 2019-03-26: 1 [IU] via SUBCUTANEOUS
  Administered 2019-03-26: 5 [IU] via SUBCUTANEOUS
  Administered 2019-03-26: 3 [IU] via SUBCUTANEOUS
  Administered 2019-03-27: 2 [IU] via SUBCUTANEOUS

## 2019-03-25 MED ORDER — INSULIN ASPART PROT & ASPART (70-30 MIX) 100 UNIT/ML ~~LOC~~ SUSP
10.0000 [IU] | Freq: Two times a day (BID) | SUBCUTANEOUS | Status: DC
  Administered 2019-03-25 – 2019-03-26 (×2): 10 [IU] via SUBCUTANEOUS

## 2019-03-25 MED ORDER — PRENATAL MULTIVITAMIN CH
1.0000 | ORAL_TABLET | Freq: Every day | ORAL | Status: DC
  Administered 2019-03-25 – 2019-03-27 (×3): 1 via ORAL
  Filled 2019-03-25: qty 1
  Filled 2019-03-25: qty 7
  Filled 2019-03-25 (×3): qty 1

## 2019-03-25 MED ORDER — INSULIN ASPART 100 UNIT/ML ~~LOC~~ SOLN
0.0000 [IU] | Freq: Every day | SUBCUTANEOUS | Status: DC
  Administered 2019-03-25: 2 [IU] via SUBCUTANEOUS

## 2019-03-25 NOTE — Progress Notes (Signed)
[facility] Group Notes:  (Nursing/MHT/Case Management/Adjunct)  Date:  03/25/2019  Time:  2030 Type of Therapy:  wrap up group  Participation Level:  Active  Participation Quality:  Appropriate, Attentive, Sharing and Supportive  Affect:  Appropriate  Cognitive:  Appropriate  Insight:  Improving  Engagement in Group:  Engaged  Modes of Intervention:  Clarification, Education and Support  Summary of Progress/Problems: Pt shared that she got to talk to her daughter today and shared that she will turn 42 on the 10th of June. Pt had questions about the different places you can administer insulin and she later spoke to the nurse about that. When pt leaves one thing she is going to do differently is to focus on tasks to get her life in order first being a place to stay. Pt is grateful for her life.   Marcille Buffy 03/25/2019, 10:26 PM

## 2019-03-25 NOTE — Progress Notes (Addendum)
Inpatient Diabetes Program Recommendations  AACE/ADA: New Consensus Statement on Inpatient Glycemic Control (2015)  Target Ranges:  Prepandial:   less than 140 mg/dL      Peak postprandial:   less than 180 mg/dL (1-2 hours)      Critically ill patients:  140 - 180 mg/dL   Results for ALVIA, JABLONSKI (MRN [id number]) as of 03/25/2019 08:51  Ref. Range 03/24/2019 14:50 03/24/2019 17:41 03/24/2019 20:28 03/25/2019 06:07  Glucose-Capillary Latest Ref Range: 70 - 99 mg/dL 369 (H) Novolog 10 units given 91 216 (H) 242 (H)    Review of Glycemic Control  Diabetes history: DM 2, New diagnosis this admission  Current orders for Inpatient glycemic control: None  Inpatient Diabetes Program Recommendations:    Consider Levemir 14 units Q24 hours Novolog 0-15 units tid + Novolog 0-5 units qhs.  Patient may benefit from Hospitalist evaluation for New DM diagnosis and insulin dosing at time of d/c.  Placed CM consult in for PCP follow up and medication assistance with her insulin at time of d/c. Patient maybe able to get get into one of our clinics and get on pay tier for insulin and/or patient may need to go to [facility] and get her insulin there for $25/vial. May consider NPH or 70/30 BID at time of d/c depending on follow up and affordability. Unsure if patient has consistent meal intake outpatient.  Will order the living well with DM booklet. Hopefully patient will get today.  Will call patient again Sunday/Monday to follow up on Education and questions in addition to d/c planning.  D/C needs: Insulin pen needles order # Glucose meter kit order # [id number]   Thanks,  Tama Headings RN, MSN, BC-ADM Inpatient Diabetes Coordinator Team Pager [phone] (8a-5p)

## 2019-03-25 NOTE — Progress Notes (Signed)
[place] NOVEL CORONAVIRUS (COVID-19) DAILY CHECK-OFF SYMPTOMS - answer yes or no to each - every day NO YES  Have you had a fever in the past 24 hours?  . Fever (Temp > 37.80C / 100F) X   Have you had any of these symptoms in the past 24 hours? . New Cough .  Sore Throat  .  Shortness of Breath .  Difficulty Breathing .  Unexplained Body Aches   X   Have you had any one of these symptoms in the past 24 hours not related to allergies?   . Runny Nose .  Nasal Congestion .  Sneezing   X   If you have had runny nose, nasal congestion, sneezing in the past 24 hours, has it worsened?  X   EXPOSURES - check yes or no X   Have you traveled outside the state in the past 14 days?  X   Have you been in contact with someone with a confirmed diagnosis of COVID-19 or PUI in the past 14 days without wearing appropriate PPE?  X   Have you been living in the same home as a person with confirmed diagnosis of COVID-19 or a PUI (household contact)?    X   Have you been diagnosed with COVID-19?    X              What to do next: Answered NO to all: Answered YES to anything:   Proceed with unit schedule Follow the [facility] Inpatient Flowsheet.   

## 2019-03-25 NOTE — Progress Notes (Signed)
[facility] MD Progress Note  03/25/2019 12:16 PM Yolanda Hendricks  MRN:  [id number] Subjective:  Yolanda Hendricks reported " feeling better today"  Evaluation: Yolanda Hendricks seen resting in day room interacting with peers.  She is awake alert and oriented x3 patient has a bright and pleasant affect.  Reports her depression has improved since her admission.  Denying suicidal or homicidal ideations.  Denies auditory visual hallucinations.  Patient is requesting to follow-up with social worker regarding housing.  Patient reports taking Prozac and Neurontin for anxiety and depression states she is tolerating medications well.  Denies alcohol cravings and/or withdrawal symptoms.  Reports good appetite.  States she is resting well throughout the night.  Support encouragement and reassurance was provided.   Principal Problem: MDD (major depressive disorder) Diagnosis: Principal Problem:   MDD (major depressive disorder)  Total Time spent with patient: 15 minutes  Past Psychiatric History:   Past Medical History:  Past Medical History:  Diagnosis Date  . Asthma     Past Surgical History:  Procedure Laterality Date  . EYE SURGERY    . TUBAL LIGATION     Family History:  Family History  Problem Relation Age of Onset  . Diabetes Other    Family Psychiatric  History:  Social History:  Social History   Substance and Sexual Activity  Alcohol Use Yes   Comment: occ     Social History   Substance and Sexual Activity  Drug Use Yes  . Types: Marijuana    Social History   Socioeconomic History  . Marital status: Single    Spouse name: Not on file  . Number of children: Not on file  . Years of education: Not on file  . Highest education level: Not on file  Occupational History  . Not on file  Social Needs  . Financial resource strain: Not on file  . Food insecurity:    Worry: Not on file    Inability: Not on file  . Transportation needs:    Medical: Not on file    Non-medical: Not on file   Tobacco Use  . Smoking status: Current Some Day Smoker    Packs/day: 0.50    Types: Cigarettes  . Smokeless tobacco: Never Used  Substance and Sexual Activity  . Alcohol use: Yes    Comment: occ  . Drug use: Yes    Types: Marijuana  . Sexual activity: Yes    Birth control/protection: Surgical  Lifestyle  . Physical activity:    Days per week: Not on file    Minutes per session: Not on file  . Stress: Not on file  Relationships  . Social connections:    Talks on phone: Not on file    Gets together: Not on file    Attends religious service: Not on file    Active member of club or organization: Not on file    Attends meetings of clubs or organizations: Not on file    Relationship status: Not on file  Other Topics Concern  . Not on file  Social History Narrative  . Not on file   Additional Social History:                         Sleep: Fair  Appetite:  Fair  Current Medications: Current Facility-Administered Medications  Medication Dose Route Frequency Provider Last Rate Last Dose  . acetaminophen (TYLENOL) tablet 650 mg  650 mg Oral Q6H PRN Antonieta Pert, MD  650 mg at 03/23/19 2114  . albuterol (VENTOLIN HFA) 108 (90 Base) MCG/ACT inhaler 1-2 puff  1-2 puff Inhalation Q6H PRN Antonieta Pertlary, Greg Lawson, MD      . alum & mag hydroxide-simeth (MAALOX/MYLANTA) 200-200-20 MG/5ML suspension 30 mL  30 mL Oral Q4H PRN Antonieta Pertlary, Greg Lawson, MD      . FLUoxetine (PROZAC) capsule 20 mg  20 mg Oral Daily Malvin JohnsFarah, Brian, MD   20 mg at 03/25/19 0851  . folic acid (FOLVITE) tablet 1 mg  1 mg Oral Daily Antonieta Pertlary, Greg Lawson, MD   1 mg at 03/25/19 0851  . gabapentin (NEURONTIN) capsule 300 mg  300 mg Oral TID Malvin JohnsFarah, Brian, MD   300 mg at 03/25/19 0851  . hydrOXYzine (ATARAX/VISTARIL) tablet 25 mg  25 mg Oral Q6H PRN Antonieta Pertlary, Greg Lawson, MD   25 mg at 03/25/19 0306  . insulin aspart (novoLOG) injection 0-9 Units  0-9 Units Subcutaneous TID WC Jackelyn PolingBerry, Jason A, NP   3 Units at 03/25/19  [phone]  . living well with diabetes book MISC   Does not apply Once Antonieta Pertlary, Greg Lawson, MD      . loperamide (IMODIUM) capsule 2-4 mg  2-4 mg Oral PRN Antonieta Pertlary, Greg Lawson, MD      . LORazepam (ATIVAN) tablet 1 mg  1 mg Oral Q6H PRN Antonieta Pertlary, Greg Lawson, MD      . multivitamin with minerals tablet 1 tablet  1 tablet Oral Daily Antonieta Pertlary, Greg Lawson, MD   1 tablet at 03/25/19 [phone]  . nicotine (NICODERM CQ - dosed in mg/24 hours) patch 21 mg  21 mg Transdermal Daily Malvin JohnsFarah, Brian, MD   21 mg at 03/25/19 [id number]  . ondansetron (ZOFRAN-ODT) disintegrating tablet 4 mg  4 mg Oral Q6H PRN Antonieta Pertlary, Greg Lawson, MD   4 mg at 03/23/19 1807  . pneumococcal 23 valent vaccine (PNU-IMMUNE) injection 0.5 mL  0.5 mL Intramuscular Tomorrow-1000 Malvin JohnsFarah, Brian, MD      . prenatal multivitamin tablet 1 tablet  1 tablet Oral Daily Antonieta Pertlary, Greg Lawson, MD   1 tablet at 03/25/19 [phone]  . propranolol (INDERAL) tablet 40 mg  40 mg Oral BID Malvin JohnsFarah, Brian, MD   40 mg at 03/25/19 [id number]  . temazepam (RESTORIL) capsule 30 mg  30 mg Oral QHS Malvin JohnsFarah, Brian, MD   30 mg at 03/24/19 2103  . thiamine (B-1) injection 100 mg  100 mg Intramuscular Once Antonieta Pertlary, Greg Lawson, MD      . thiamine (VITAMIN B-1) tablet 100 mg  100 mg Oral Daily Antonieta Pertlary, Greg Lawson, MD   100 mg at 03/25/19 [id number]    Lab Results:  Results for orders placed or performed during the hospital encounter of 03/23/19 (from the past 48 hour(s))  Hemoglobin A1c     Status: Abnormal   Collection Time: 03/24/19  6:24 AM  Result Value Ref Range   Hgb A1c MFr Bld 10.7 (H) 4.8 - 5.6 %    Comment: (NOTE) Pre diabetes:          5.7%-6.4% Diabetes:              >6.4% Glycemic control for   <7.0% adults with diabetes    Mean Plasma Glucose 260.39 mg/dL    Comment: Performed at [facility], [address] [place], [place] [address]  Lipid panel     Status: Abnormal   Collection Time: 03/24/19  6:24 AM  Result Value Ref Range   Cholesterol 165 0 - 200  mg/dL   Triglycerides 379 (H)  <150 mg/dL   HDL 60 >43 mg/dL   Total CHOL/HDL Ratio 2.8 RATIO   VLDL 54 (H) 0 - 40 mg/dL   LDL Cholesterol 51 0 - 99 mg/dL    Comment:        Total Cholesterol/HDL:CHD Risk Coronary Heart Disease Risk Table                     Men   Women  1/2 Average Risk   3.4   3.3  Average Risk       5.0   4.4  2 X Average Risk   9.6   7.1  3 X Average Risk  23.4   11.0        Use the calculated Patient Ratio above and the CHD Risk Table to determine the patient's CHD Risk.        ATP III CLASSIFICATION (LDL):  <100     mg/dL   Optimal  276-147  mg/dL   Near or Above                    Optimal  130-159  mg/dL   Borderline  092-957  mg/dL   High  >473     mg/dL   Very High Performed at [facility], [address] [place], [place] [address]   TSH     Status: None   Collection Time: 03/24/19  6:24 AM  Result Value Ref Range   TSH 1.355 0.350 - 4.500 uIU/mL    Comment: Performed by a 3rd Generation assay with a functional sensitivity of <=0.01 uIU/mL. Performed at [facility], [address], [place], [place] [address]   Glucose, capillary     Status: Abnormal   Collection Time: 03/24/19  2:50 PM  Result Value Ref Range   Glucose-Capillary 369 (H) 70 - 99 mg/dL   Comment 1 Notify RN    Comment 2 Document in Chart   Glucose, capillary     Status: None   Collection Time: 03/24/19  5:41 PM  Result Value Ref Range   Glucose-Capillary 91 70 - 99 mg/dL   Comment 1 Notify RN    Comment 2 Document in Chart   Glucose, capillary     Status: Abnormal   Collection Time: 03/24/19  8:28 PM  Result Value Ref Range   Glucose-Capillary 216 (H) 70 - 99 mg/dL   Comment 1 Notify RN    Comment 2 Document in Chart   Glucose, capillary     Status: Abnormal   Collection Time: 03/25/19  6:07 AM  Result Value Ref Range   Glucose-Capillary 242 (H) 70 - 99 mg/dL  Glucose, capillary     Status: Abnormal   Collection Time: 03/25/19 11:55 AM  Result Value Ref Range    Glucose-Capillary 299 (H) 70 - 99 mg/dL   Comment 1 Notify RN    Comment 2 Document in Chart     Blood Alcohol level:  Lab Results  Component Value Date   ETH 288 (H) 03/23/2019    Metabolic Disorder Labs: Lab Results  Component Value Date   HGBA1C 10.7 (H) 03/24/2019   MPG 260.39 03/24/2019   No results found for: PROLACTIN Lab Results  Component Value Date   CHOL 165 03/24/2019   TRIG 271 (H) 03/24/2019   HDL 60 03/24/2019   CHOLHDL 2.8 03/24/2019   VLDL 54 (H) 03/24/2019   LDLCALC 51  03/24/2019    Physical Findings: AIMS:  , ,  ,  ,    CIWA:  CIWA-Ar Total: 3 COWS:     Musculoskeletal: Strength & Muscle Tone: within normal limits Gait & Station: normal Patient leans: N/A  Psychiatric Specialty Exam: Physical Exam  Constitutional: She appears well-developed.    Review of Systems  Psychiatric/Behavioral: Positive for depression. Negative for suicidal ideas. The patient is nervous/anxious.   All other systems reviewed and are negative.   Blood pressure (!) 136/92, pulse 73, temperature 98.1 F (36.7 C), temperature source Oral, resp. rate 16, height  (1.702 m), weight 71.7 kg, SpO2 99 %.Body mass index is 24.75 kg/m.  General Appearance: Casual  Eye Contact:  Good  Speech:  Clear and Coherent  Volume:  Normal  Mood:  Depressed  Affect:  Congruent  Thought Process:  Coherent  Orientation:  Full (Time, Place, and Person)  Thought Content:  Hallucinations: None  Suicidal Thoughts:  No  Homicidal Thoughts:  No  Memory:  Immediate;   Fair Recent;   Fair Remote;   Fair  Judgement:  Fair  Insight:  Fair  Psychomotor Activity:  Normal  Concentration:  Concentration: Fair  Recall:  [employer] of Knowledge:  Fair  Language:  Fair  Akathisia:  No  Handed:  Right  AIMS (if indicated):     Assets:  Communication Skills Desire for Improvement Resilience Social Support  ADL's:  Intact  Cognition:  WNL  Sleep:  Number of Hours: 4.75      Treatment Plan Summary: Daily contact with patient to assess and evaluate symptoms and progress in treatment and Medication management   Continue with current treatment plan on 03/25/2019 as listed below except were noted  Major depressive disorder  Continue Prozac 20 mg p.o. daily Continue Neurontin 300 mg p.o. 3 times daily Continue Restoril 30 mg p.o. nightly  CIWA protocol  CSW to continue working on discharge disposition Patient encouraged to participate within the therapeutic milieu  Oneta Rack, NP 03/25/2019, 12:16 PM

## 2019-03-25 NOTE — Plan of Care (Signed)
  Problem: Education: Goal: Knowledge of [facility] Education information/materials will improve Outcome: Progressing Goal: Emotional status will improve Outcome: Progressing   

## 2019-03-25 NOTE — Progress Notes (Signed)
D Pt is observed OOB UAL on the 300 hall this am. She is less depressed in appearance. She wears hospital-issue patient scrubs. Her hair is neatly combed and pulled back. She says " I feel so much better.Marland Kitchenibuprofen havent combed my hair in three days and now I have a comb!". She is clean. She says she just took her shower and that she ate breakfast this mornng.     A She completed her daily assessment and on this she wrote she denied SI today and she rrated her depression, hopelessness and anxiety " 5/5/7", respectively. She says " I want to go to groups today.Marland Kitchenibuprofen want t workbook.Marland Kitchenibuprofen need to make some phone calls and get myself together. ".     R Safety in place. Support offered by Clinical research associate.

## 2019-03-25 NOTE — [facility] Group Notes (Signed)
LCSW Group Therapy Note  03/25/2019    10:00-11:00am   Type of Therapy and Topic:  Group Therapy: Anger and Coping Skills  Participation Level:  Active   Description of Group:   In this group, patients learned how to recognize the physical, cognitive, emotional, and behavioral responses they have to anger-provoking situations.  They identified how they usually or often react when angered, and learned how healthy and unhealthy coping skills work initially, but the unhealthy ones stop working.   They analyzed how their frequently-chosen coping skill is possibly beneficial and how it is possibly unhelpful.  The group discussed a variety of healthier coping skills that could help in resolving the actual issues, as well as how to go about planning for the the possibility of future similar situations.  Therapeutic Goals: 1. Patients will identify one thing that makes them angry and how they feel emotionally and physically, what their thoughts are or tend to be in those situations, and what healthy or unhealthy coping mechanism they typically use 2. Patients will identify how their coping technique works for them, as well as how it works against them. 3. Patients will explore possible new behaviors to use in future anger situations. 4. Patients will learn that anger itself is normal and cannot be eliminated, and that healthier coping skills can assist with resolving conflict rather than worsening situations.  Summary of Patient Progress:  The patient shared that she was "annoyed and disrespected" a few days ago when she was about to be hospitalized.  She participated fully throughout group and interacted well with other patients.  Therapeutic Modalities:   Cognitive Behavioral Therapy Motivation Interviewing  [name]  .

## 2019-03-26 LAB — GLUCOSE, CAPILLARY
Glucose-Capillary: 235 mg/dL — ABNORMAL HIGH (ref 70–99)
Glucose-Capillary: 145 mg/dL — ABNORMAL HIGH (ref 70–99)
Glucose-Capillary: 293 mg/dL — ABNORMAL HIGH (ref 70–99)
Glucose-Capillary: 138 mg/dL — ABNORMAL HIGH (ref 70–99)

## 2019-03-26 MED ORDER — INSULIN ASPART PROT & ASPART (70-30 MIX) 100 UNIT/ML ~~LOC~~ SUSP
15.0000 [IU] | Freq: Two times a day (BID) | SUBCUTANEOUS | Status: DC
  Administered 2019-03-26 – 2019-03-27 (×2): 15 [IU] via SUBCUTANEOUS
  Filled 2019-03-26: qty 0.15

## 2019-03-26 NOTE — Progress Notes (Addendum)
Inpatient Diabetes Program Recommendations  AACE/ADA: New Consensus Statement on Inpatient Glycemic Control (2015)  Target Ranges:  Prepandial:   less than 140 mg/dL      Peak postprandial:   less than 180 mg/dL (1-2 hours)      Critically ill patients:  140 - 180 mg/dL   Lab Results  Component Value Date   GLUCAP 235 (H) 03/26/2019   HGBA1C 10.7 (H) 03/24/2019    Review of Glycemic Control  Diabetes history: New DM diagnosis  Current orders for Inpatient glycemic control:  70/30 15 units bid Novolog 0-9 units tid Novolog 0-5 units qhs  Inpatient Diabetes Program Recommendations:    Glucose trends in the 200's still. Consider increasing 70/30 to 15 units bid.  Will speak with patient today.  Addendum 1150 am:  Spoke with patient about new diabetes diagnosis.  Discussed A1C results (10.7%) and explained what an A1C is. Discussed basic pathophysiology of DM Type 2, basic home care, importance of checking CBGs and maintaining good CBG control to prevent long-term and short-term complications.   Reviewed glucose and A1C goals.  Reviewed signs and symptoms of hyperglycemia and hypoglycemia along with treatment for both. Discussed impact of nutrition, exercise, stress, sickness, and medications on diabetes control.   Discussed in detail carbohydrates, carbohydrate goals per day and meal, along with portion sizes.  Reviewed Living Well with diabetes booklet and encouraged patient to read through entire book.   Informed patient that she will be prescribed Novolin 70/30 since it is more affordable. Informed patient that Novolin 70/30 can be purchased at [facility] for $25 per vial. Provided patient with handout information on Reli-On products and encouraged patient to go to [facility] to get the Reli-On Prime glucometer for $9 and a box of 50 Reli-On test strips for $9.   Discussed 70/30 insulin in detail (how to take it, when to take it). Asked patient to check her glucose at least 2-4  times per day (before meals and at bedtime) and to keep a log book of glucose readings and insulin taken.   Informed patient that RN will be asking her to self-administer insulin to ensure proper technique and ability to administer self insulin shots.     Patient verbalized understanding of information discussed and has no further questions at this time related to diabetes.    RNs to provide ongoing basic DM education at bedside with this patient and engage patient to actively check blood glucose and administer insulin injections.   RN to give patient 70/30 vial insulin at time of d/c along with 10-12 syringes so patient will have time to buy syringes over the counter.  Thanks,  Christena Deem RN, MSN, BC-ADM Inpatient Diabetes Coordinator Team Pager [phone] (8a-5p)

## 2019-03-26 NOTE — Progress Notes (Signed)
Diabetic nurse, Carollee Herter phone [phone], called to talk to patient.  Patient will return call to [facility].

## 2019-03-26 NOTE — Progress Notes (Signed)
D:  Patient's self inventory sheet, patient has poor sleep, sleep medication not helpful.  Good appetite, normal energy level, poor concentration.  Rated depression and hopeless 5, anxiety 7.  Denied withdrawals.  Denied SI.  Denied physical problems.  Denied physical pain.  Goal is trusting in herself.  Plans to stay positive.  Does have discharge plans. A:  Medications administered per MD orders.  Emotional support and encouragement given patient. R:  Patient denied SI and HI, contracts for safety.  Denied A/V hallucinations.  Safety maintained with 15 minute checks.

## 2019-03-26 NOTE — [facility] Group Notes (Signed)
[facility] LCSW Group Therapy Note  03/26/2019  10:00-11:00AM  Type of Therapy and Topic:  Group Therapy:  Adding Supports Including Yourself  Participation Level:  Minimal   Description of Group:  Patients in this group were introduced to the concept that additional supports including self-support are an essential part of recovery.  Patients listed what supports they believe they need to add to their lives to achieve their goals at discharge, and they listed such things as therapist, family, doctor, support groups, and service dog.  CSW described the  continuum of mental health/substance abuse services available and the group discussed the differences among these including support group, therapy group, 12-step group, Doctor, hospital, Secondary school teacher, and such.  A song entitled "My Own Hero" was played and a group discussion ensued in which patients stated they could relate to the song and it inspired them to realize they have be willing to help themselves in order to succeed, because other people cannot achieve sobriety or stability for them.  A song was played called "I Am Enough" which led to a discussion about being willing to believe we are worth the effort of being a self-support.  Group members expressed appreciation for each other.  Therapeutic Goals: 1)  demonstrate the importance of being a key part of one's own support system 2)  discuss various available supports 3)  encourage patient to use music as part of their self-support and focus on goals 4)  elicit ideas from patients about supports that need to be added   Summary of Patient Progress:  The patient expressed that her healthy supports include her boyfriend and kids, while her unhealthy support is herself.  She was unhappy that the TV was turned off for group and did not participate unless called on directly.  She kept her eyes closed throughout group, lounged in a sleeping fashion in her chair, and seemed  disinterested.  Therapeutic Modalities:   Motivational Interviewing Activity  [name]

## 2019-03-26 NOTE — Progress Notes (Signed)
D: Pt was in dayroom upon initial approach.  Pt presents with anxious, depressed affect and mood.  Brightens with interaction.  Reports her day was "fine" and her goal is "I need some sleep."  Pt denies SI/HI, denies hallucinations, denies pain.  Pt has been visible in milieu interacting with peers and staff appropriately.      A: Met with pt 1:1.  Actively listened to pt and offered support and encouragement. Medications administered per order.  PRN medication administered for anxiety.  Q15 minute safety checks in place.  R: Pt is safe on the unit.  Pt is compliant with medications.  Pt verbally contracts for safety.  Will continue to monitor and assess.

## 2019-03-26 NOTE — Discharge Instructions (Signed)
Pharmacist, community Syringes 31 gauge Glucose tablets Alcohol pads   Can choose between insulin vial and syringe or insulin pen after this month   Glucose Products:  ReliOn glucose products raise low blood sugar fast. Tablets are free of fat, caffeine, sodium and gluten. They are portable and easy to carry, making it easier for people with diabetes to BE PREPARED for lows.  Glucose Tablets Available in 6 flavors  10 ct...................................... $1.00   50 ct...................................... $3.98   Alcohol Swabs Alcohol swabs are used to sterilize your injection site. All of our swabs are individually wrapped for maximum safety, convenience and moisture retention. ReliOn Alcohol Swabs  100 ct Swabs..............................$1.00  400 ct Swabs..............................$3.74  Blood Glucose Monitors  Premier Test Strips  50 test strips.................................... $9.00  100 test strips.................................$17.88  Premier [employer] Kit    $19.44 Kit includes:  50 test strips  10 lancets  Lancing device  Carry case  ReliOn Lancets  100 ct Lancets $1.56  200 ct Lancets $2.64 Try ultra thin lancets  Human Insulin   Novolin/ReliOn Insulin* with Vial..........$24.88 You need 70/30 insulin  ReliOn Insulin Syringes*  100 ct . $12.58 Available in 31G (3/10cc, 1/2cc & 1cc units)   This is another way to give insulin with an insulin pen. It does cost more. You DO NOT need a prescription just ask for [facility] Relion brand 70/30. With the insulin pen you will need the pen needles to go with them  Novolin/ReliOn Insulin Pens*    $42.88 (for a box of 5 insulin pens) 70/30  ReliOn Pen Needles*  50 ct....................................................$9.00 You need 53m

## 2019-03-26 NOTE — Progress Notes (Addendum)
[facility] MD Progress Note  03/26/2019 11:01 AM Yolanda Hendricks  MRN:  [id number]  Evaluation: Yolanda Hendricks seen sitting in day room interacting with peers.  She presents pleasant, cooperative.  Continues to deny suicidal or homicidal ideations.  Denies auditory visual hallucinations.  Patient is requesting to speak to the social worker regarding outpatient resources.  Patient continues to deny alcohol cravings and/or withdrawal symptoms.  She is states she is hopeful to discharge on tomorrow.  Rates her depression 3 out of 10 with 10 being the worst.  Reported interrupted sleep due to new roommate on last night.  Reports a fair appetite.  Nutritionist review chart after diabetic consult.  Support, encouragement and reassurance was provided.  Principal Problem: MDD (major depressive disorder) Diagnosis: Principal Problem:   MDD (major depressive disorder)  Total Time spent with patient: 15 minutes  Past Psychiatric History:   Past Medical History:  Past Medical History:  Diagnosis Date  . Asthma     Past Surgical History:  Procedure Laterality Date  . EYE SURGERY    . TUBAL LIGATION     Family History:  Family History  Problem Relation Age of Onset  . Diabetes Other    Family Psychiatric  History:  Social History:  Social History   Substance and Sexual Activity  Alcohol Use Yes   Comment: occ     Social History   Substance and Sexual Activity  Drug Use Yes  . Types: Marijuana    Social History   Socioeconomic History  . Marital status: Single    Spouse name: Not on file  . Number of children: Not on file  . Years of education: Not on file  . Highest education level: Not on file  Occupational History  . Not on file  Social Needs  . Financial resource strain: Not on file  . Food insecurity:    Worry: Not on file    Inability: Not on file  . Transportation needs:    Medical: Not on file    Non-medical: Not on file  Tobacco Use  . Smoking status: Current Some Day Smoker     Packs/day: 0.50    Types: Cigarettes  . Smokeless tobacco: Never Used  Substance and Sexual Activity  . Alcohol use: Yes    Comment: occ  . Drug use: Yes    Types: Marijuana  . Sexual activity: Yes    Birth control/protection: Surgical  Lifestyle  . Physical activity:    Days per week: Not on file    Minutes per session: Not on file  . Stress: Not on file  Relationships  . Social connections:    Talks on phone: Not on file    Gets together: Not on file    Attends religious service: Not on file    Active member of club or organization: Not on file    Attends meetings of clubs or organizations: Not on file    Relationship status: Not on file  Other Topics Concern  . Not on file  Social History Narrative  . Not on file   Additional Social History:                         Sleep: Fair  Appetite:  Fair  Current Medications: Current Facility-Administered Medications  Medication Dose Route Frequency Provider Last Rate Last Dose  . acetaminophen (TYLENOL) tablet 650 mg  650 mg Oral Q6H PRN Antonieta Pert, MD   [facility]  [facility] 2114  . albuterol (VENTOLIN HFA) 108 (90 Base) MCG/ACT inhaler 1-2 puff  1-2 puff Inhalation Q6H PRN Antonieta Pertlary, Greg Lawson, MD      . alum & mag hydroxide-simeth (MAALOX/MYLANTA) 200-200-20 MG/5ML suspension 30 mL  30 mL Oral Q4H PRN Antonieta Pertlary, Greg Lawson, MD      . FLUoxetine (PROZAC) capsule 20 mg  20 mg Oral Daily Malvin JohnsFarah, Brian, MD   20 mg at 03/26/19 0853  . folic acid (FOLVITE) tablet 1 mg  1 mg Oral Daily Antonieta Pertlary, Greg Lawson, MD   1 mg at 03/26/19 0853  . gabapentin (NEURONTIN) capsule 300 mg  300 mg Oral TID Malvin JohnsFarah, Brian, MD   300 mg at 03/26/19 0853  . hydrOXYzine (ATARAX/VISTARIL) tablet 25 mg  25 mg Oral Q6H PRN Antonieta Pertlary, Greg Lawson, MD   25 mg at 03/25/19 2116  . insulin aspart (novoLOG) injection 0-5 Units  0-5 Units Subcutaneous QHS Antonieta Pertlary, Greg Lawson, MD   2 Units at 03/25/19 2115  . insulin aspart (novoLOG) injection 0-9 Units  0-9  Units Subcutaneous TID WC Antonieta Pertlary, Greg Lawson, MD   3 Units at 03/26/19 [phone]  . insulin aspart protamine- aspart (NOVOLOG MIX 70/30) injection 15 Units  15 Units Subcutaneous BID WC Antonieta Pertlary, Greg Lawson, MD      . living well with diabetes book MISC   Does not apply Once Antonieta Pertlary, Greg Lawson, MD      . loperamide (IMODIUM) capsule 2-4 mg  2-4 mg Oral PRN Antonieta Pertlary, Greg Lawson, MD      . LORazepam (ATIVAN) tablet 1 mg  1 mg Oral Q6H PRN Antonieta Pertlary, Greg Lawson, MD      . multivitamin with minerals tablet 1 tablet  1 tablet Oral Daily Antonieta Pertlary, Greg Lawson, MD   1 tablet at 03/26/19 0855  . nicotine (NICODERM CQ - dosed in mg/24 hours) patch 21 mg  21 mg Transdermal Daily Malvin JohnsFarah, Brian, MD   21 mg at 03/26/19 0854  . ondansetron (ZOFRAN-ODT) disintegrating tablet 4 mg  4 mg Oral Q6H PRN Antonieta Pertlary, Greg Lawson, MD   4 mg at 03/23/19 1807  . prenatal multivitamin tablet 1 tablet  1 tablet Oral Daily Antonieta Pertlary, Greg Lawson, MD   1 tablet at 03/26/19 [phone]  . propranolol (INDERAL) tablet 40 mg  40 mg Oral BID Malvin JohnsFarah, Brian, MD   40 mg at 03/26/19 0854  . temazepam (RESTORIL) capsule 30 mg  30 mg Oral QHS Malvin JohnsFarah, Brian, MD   30 mg at 03/25/19 2116  . thiamine (B-1) injection 100 mg  100 mg Intramuscular Once Antonieta Pertlary, Greg Lawson, MD      . thiamine (VITAMIN B-1) tablet 100 mg  100 mg Oral Daily Antonieta Pertlary, Greg Lawson, MD   100 mg at 03/26/19 [id number]    Lab Results:  Results for orders placed or performed during the hospital encounter of 03/23/19 (from the past 48 hour(s))  Glucose, capillary     Status: Abnormal   Collection Time: 03/24/19  2:50 PM  Result Value Ref Range   Glucose-Capillary 369 (H) 70 - 99 mg/dL   Comment 1 Notify RN    Comment 2 Document in Chart   Glucose, capillary     Status: None   Collection Time: 03/24/19  5:41 PM  Result Value Ref Range   Glucose-Capillary 91 70 - 99 mg/dL   Comment 1 Notify RN    Comment 2 Document in Chart   Glucose, capillary     Status: Abnormal   Collection Time:  03/24/19  8:28 PM   Result Value Ref Range   Glucose-Capillary 216 (H) 70 - 99 mg/dL   Comment 1 Notify RN    Comment 2 Document in Chart   Glucose, capillary     Status: Abnormal   Collection Time: 03/25/19  6:07 AM  Result Value Ref Range   Glucose-Capillary 242 (H) 70 - 99 mg/dL  Glucose, capillary     Status: Abnormal   Collection Time: 03/25/19 11:55 AM  Result Value Ref Range   Glucose-Capillary 299 (H) 70 - 99 mg/dL   Comment 1 Notify RN    Comment 2 Document in Chart   Glucose, capillary     Status: Abnormal   Collection Time: 03/25/19  5:26 PM  Result Value Ref Range   Glucose-Capillary 299 (H) 70 - 99 mg/dL   Comment 1 Notify RN    Comment 2 Document in Chart   Glucose, capillary     Status: Abnormal   Collection Time: 03/25/19  8:37 PM  Result Value Ref Range   Glucose-Capillary 203 (H) 70 - 99 mg/dL   Comment 1 Notify RN    Comment 2 Document in Chart   Glucose, capillary     Status: Abnormal   Collection Time: 03/26/19  5:38 AM  Result Value Ref Range   Glucose-Capillary 235 (H) 70 - 99 mg/dL   Comment 1 Notify RN    Comment 2 Document in Chart     Blood Alcohol level:  Lab Results  Component Value Date   ETH 288 (H) 03/23/2019    Metabolic Disorder Labs: Lab Results  Component Value Date   HGBA1C 10.7 (H) 03/24/2019   MPG 260.39 03/24/2019   No results found for: PROLACTIN Lab Results  Component Value Date   CHOL 165 03/24/2019   TRIG 271 (H) 03/24/2019   HDL 60 03/24/2019   CHOLHDL 2.8 03/24/2019   VLDL 54 (H) 03/24/2019   LDLCALC 51 03/24/2019    Physical Findings: AIMS:  , ,  ,  ,    CIWA:  CIWA-Ar Total: 3 COWS:     Musculoskeletal: Strength & Muscle Tone: within normal limits Gait & Station: normal Patient leans: N/A  Psychiatric Specialty Exam: Physical Exam  Constitutional: She appears well-developed.    Review of Systems  Psychiatric/Behavioral: Positive for depression. Negative for suicidal ideas. The patient is nervous/anxious.   All  other systems reviewed and are negative.   Blood pressure (!) 120/100, pulse 71, temperature 98 F (36.7 C), temperature source Oral, resp. rate 16, height 5\' 7"  (1.702 m), weight 71.7 kg, SpO2 99 %.Body mass index is 24.75 kg/m.  General Appearance: Casual  Eye Contact:  Good  Speech:  Clear and Coherent  Volume:  Normal  Mood:  Depressed  Affect:  Congruent  Thought Process:  Coherent  Orientation:  Full (Time, Place, and Person)  Thought Content:  Hallucinations: None  Suicidal Thoughts:  No  Homicidal Thoughts:  No  Memory:  Immediate;   Fair Recent;   Fair Remote;   Fair  Judgement:  Fair  Insight:  Fair  Psychomotor Activity:  Normal  Concentration:  Concentration: Fair  Recall:  [employer] of Knowledge:  Fair  Language:  Fair  Akathisia:  No  Handed:  Right  AIMS (if indicated):     Assets:  Communication Skills Desire for Improvement Resilience Social Support  ADL's:  Intact  Cognition:  WNL  Sleep:  Number of Hours: 4.25     Treatment  Plan Summary: Daily contact with patient to assess and evaluate symptoms and progress in treatment and Medication management   Continue with current treatment plan on 03/26/2019 as listed below except were noted  Major depressive disorder  Continue Prozac 20 mg p.o. daily Continue Neurontin 300 mg p.o. 3 times daily Continue Restoril 30 mg p.o. nightly  CIWA protocol  CSW to continue working on discharge disposition Patient encouraged to participate within the therapeutic milieu  Oneta Rack, NP 03/26/2019, 11:01 AM

## 2019-03-26 NOTE — Plan of Care (Signed)
Nurse discussed anxiety, depression and coping skills with patient.  

## 2019-03-26 NOTE — Progress Notes (Signed)
Late entry for 03/25/2019  Type of Therapy:  Psychoeducational Skills  Participation Level:  Good  Participation Quality:Good   Affect:Flat  Cognitive:  Intac6t  Insight Growing    :   Engagement in Group: Engaged  Modes of Intervention:  Discussion    Summary of Progress/Problems: pt did not attend life skills group.

## 2019-03-27 LAB — GLUCOSE, CAPILLARY: Glucose-Capillary: 197 mg/dL — ABNORMAL HIGH (ref 70–99)

## 2019-03-27 MED ORDER — GABAPENTIN 300 MG PO CAPS: 300.0000 mg | ORAL_CAPSULE | Freq: Three times a day (TID) | ORAL | 2 refills | Status: DC

## 2019-03-27 MED ORDER — FLUOXETINE HCL 20 MG PO CAPS: 20.0000 mg | ORAL_CAPSULE | Freq: Every day | ORAL | 3 refills | Status: DC

## 2019-03-27 MED ORDER — INSULIN ASPART PROT & ASPART (70-30 MIX) 100 UNIT/ML ~~LOC~~ SUSP: 15.0000 [IU] | Freq: Two times a day (BID) | SUBCUTANEOUS | 11 refills | Status: DC

## 2019-03-27 MED ORDER — PRENATAL MULTIVITAMIN CH: 1.0000 | ORAL_TABLET | Freq: Every day | ORAL | 2 refills | Status: DC

## 2019-03-27 MED ORDER — PROPRANOLOL HCL 40 MG PO TABS: 40.0000 mg | ORAL_TABLET | Freq: Two times a day (BID) | ORAL | 1 refills | Status: DC

## 2019-03-27 MED ORDER — ALBUTEROL SULFATE HFA 108 (90 BASE) MCG/ACT IN AERS: 1.0000 | INHALATION_SPRAY | Freq: Four times a day (QID) | RESPIRATORY_TRACT | 2 refills | Status: DC | PRN
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
## 2019-03-27 NOTE — Progress Notes (Signed)
Discharge Note:  Patient discharged with family member.  Patient denied SI and HI.  Denied A/V hallucinations.  Denied pain.  Suicide prevention information given and discussed with patient who stated she understood and had no questions.  Patient stated she received all her belongings, clothing, toiletries, etc.  Patient stated she appreciated all assistance received from [facility] staff.  All required discharge information given to patient at discharge.

## 2019-03-27 NOTE — [facility] Suicide Risk Assessment (Signed)
[facility] Discharge Suicide Risk Assessment   Principal Problem: MDD (major depressive disorder) Discharge Diagnoses: Principal Problem:   MDD (major depressive disorder)   Total Time spent with patient: 45 minutes  Musculoskeletal: Strength & Muscle Tone: within normal limits Gait & Station: normal Patient leans: N/A  Psychiatric Specialty Exam: ROS  Blood pressure 130/87, pulse 85, temperature 98.4 F (36.9 C), temperature source Oral, resp. rate 16, height 5\' 7"  (1.702 m), weight 71.7 kg, SpO2 100 %.Body mass index is 24.75 kg/m.  General Appearance: Casual  Eye Contact::  Good  Speech:  Clear and Coherent409  Volume:  Normal  Mood:  Euthymic  Affect:  Constricted  Thought Process:  Coherent  Orientation:  Full (Time, Place, and Person)  Thought Content:  Logical and Rumination  Suicidal Thoughts:  No  Homicidal Thoughts:  No  Memory:  Immediate;   Good  Judgement:  Fair  Insight:  Good  Psychomotor Activity:  Normal  Concentration:  Good  Recall:  Good  Fund of Knowledge:Fair  Language: Good  Akathisia:  Negative  Handed:  Right  AIMS (if indicated):     Assets:  Communication Skills Desire for Improvement Leisure Time Physical Health Resilience  Sleep:  Number of Hours: 5.25  Cognition: WNL  ADL's:  Intact   Mental Status Per Nursing Assessment::   On Admission:  Suicidal ideation indicated by patient Currently stable for release alert and oriented states she has benefited from the program and med adjustments and requesting discharge home Demographic Factors:  Low socioeconomic status  Loss Factors: Decrease in vocational status  Historical Factors: NA  Risk Reduction Factors:   Religious beliefs about death  Continued Clinical Symptoms:  Depression:   Severe  Cognitive Features That Contribute To Risk:  None    Suicide Risk:  Minimal: No identifiable suicidal ideation.  Patients presenting with no risk factors but with morbid ruminations; may  be classified as minimal risk based on the severity of the depressive symptoms    Plan Of Care/Follow-up recommendations:  Activity:  full  Yolanda Melhorn, MD 03/27/2019, 8:22 AM

## 2019-03-27 NOTE — Progress Notes (Signed)
  [facility] Adult Case Management Discharge Plan :  Will you be returning to the same living situation after discharge:  Yes,  a hotel At discharge, do you have transportation home?: Yes,  pt's boyfriend Do you have the ability to pay for your medications: No.; [facility]  Release of information consent forms completed and in the chart;  Patient's signature needed at discharge.  Patient to Follow up at: Follow-up Information    [facility] Follow up.   Why:  Office is closed due to the holiday. Patient will contact you with hospital follow up appointment. Contact information: [address] [place] [address] [phone]           Next level of care provider has access to [facility] Link:no  Safety Planning and Suicide Prevention discussed: Yes,  with boyfriend  Have you used any form of tobacco in the last 30 days? (Cigarettes, Smokeless Tobacco, Cigars, and/or Pipes): Yes  Has patient been referred to the Quitline?: Patient refused referral ; pt left before able to try to complete outline paperwork.   Patient has been referred for addiction treatment: Yes  Delphia Grates, LCSW 03/27/2019, 11:20 AM

## 2019-03-27 NOTE — Progress Notes (Signed)
D:  Patient's self inventory sheet, patient sleeps good, sleep medication helpful.  Good appetite, normal energy level, good concentration.  Denied depression and hopeless, rated anxiety #6.  Denied withdrawals.  Denied SI.  Denied physical problems.  Denied physical pain.  Goal is discharge.  Plans to leave.  Does have discharge plans. A:  Medications administered per MD orders.  Emotional support and encouragement given patient. R:  Patient denied SI and HI, contracts for safety.  Denied A/V hallucinations.  Safety maintained with 15 minute checks.

## 2019-03-27 NOTE — Progress Notes (Signed)
D:  Yolanda Hendricks was up and visible on the unit.  She was noted interacting well with staff and peers.  She attended evening wrap up group.  She reported that she is feeling better but continues to have difficulty sleeping.  She denied SI/HI or A/V hallucinations.  Her hs blood sugar was 138 and she was excited that it has been coming down.  She denied any pain or discomfort and appears to be in no physical distress.  She took her hs medications without difficulties and no prn medication given this shift.  She is currently resting with her eyes closed and appears to be asleep. A:  1:1 with RN for support and encouragement.  Medications as ordered.  Q 15 minute checks maintained for safety.  Encouraged participation in group and unit activities.   R:  Yolanda Hendricks remains safe on the unit.  We will continue to monitor the progress towards her goals.

## 2019-03-27 NOTE — BHH Suicide Risk Assessment (Signed)
[facility] INPATIENT:  Family/Significant Other Suicide Prevention Education  Suicide Prevention Education:  Education Completed; Pt's boyfriend, Crissie Reese, has been identified by the patient as the family member/significant other with whom the patient will be residing, and identified as the person(s) who will aid the patient in the event of a mental health crisis (suicidal ideations/suicide attempt).  With written consent from the patient, the family member/significant other has been provided the following suicide prevention education, prior to the and/or following the discharge of the patient.  The suicide prevention education provided includes the following:  Suicide risk factors  Suicide prevention and interventions  National Suicide Hotline telephone number  [facility] assessment telephone number  [facility] Emergency Assistance 911  [facility] and/or Residential Mobile Crisis Unit telephone number  Request made of family/significant other to:  Remove weapons (e.g., guns, rifles, knives), all items previously/currently identified as safety concern.    Remove drugs/medications (over-the-counter, prescriptions, illicit drugs), all items previously/currently identified as a safety concern.  The family member/significant other verbalizes understanding of the suicide prevention education information provided.  The family member/significant other agrees to remove the items of safety concern listed above.   CSW contacted pt's boyfriend, Crissie Reese. CSW let boyfriend know that patient is getting discharged. Pt's boyfriend does not report any concerns or questions.    Delphia Grates 03/27/2019, 11:18 AM

## 2019-03-27 NOTE — BHH Suicide Risk Assessment (Signed)
[facility] INPATIENT:  Family/Significant Other Suicide Prevention Education  Suicide Prevention Education:  Contact Attempts: Pt's boyfriend, Crissie Reese, has been identified by the patient as the family member/significant other with whom the patient will be residing, and identified as the person(s) who will aid the patient in the event of a mental health crisis.  With written consent from the patient, two attempts were made to provide suicide prevention education, prior to and/or following the patient's discharge.  We were unsuccessful in providing suicide prevention education.  A suicide education pamphlet was given to the patient to share with family/significant other.  Date and time of first attempt:03/27/2019 @ 9am Date and time of second attempt:  Delphia Grates 03/27/2019, 9:02 AM

## 2019-03-28 NOTE — Care Management (Signed)
CMA attempted to contact patient (listed phone number on facesheet)  to inform patient of hospital follow up appointment with [facility] on Wednesday, 5/27 at 9:00a.  Patient's phone is currently out of service.   CMA attempted to contact patient's daughter, however, there was no answer. CMA left voicemessage for patient's daughter with appointment information.    Geonna Lockyer Care Management Assistant  Email:[email or web address] Office: [phone]

## 2019-05-26 ENCOUNTER — Emergency Department (HOSPITAL_COMMUNITY): Payer: Self-pay

## 2019-05-26 ENCOUNTER — Other Ambulatory Visit: Payer: Self-pay

## 2019-05-26 ENCOUNTER — Emergency Department (HOSPITAL_COMMUNITY)
Admission: EM | Admit: 2019-05-26 | Discharge: 2019-05-26 | Disposition: A | Discharge status: Home / Self Care | Payer: Self-pay | Attending: Emergency Medicine | Admitting: Emergency Medicine

## 2019-05-26 ENCOUNTER — Encounter (HOSPITAL_COMMUNITY): Payer: Self-pay

## 2019-05-26 DIAGNOSIS — X501XXA Overexertion from prolonged static or awkward postures, initial encounter: Secondary | ICD-10-CM | POA: Insufficient documentation

## 2019-05-26 DIAGNOSIS — Y9389 Activity, other specified: Secondary | ICD-10-CM | POA: Insufficient documentation

## 2019-05-26 DIAGNOSIS — Y999 Unspecified external cause status: Secondary | ICD-10-CM | POA: Insufficient documentation

## 2019-05-26 DIAGNOSIS — S82831A Other fracture of upper and lower end of right fibula, initial encounter for closed fracture: Secondary | ICD-10-CM

## 2019-05-26 DIAGNOSIS — J45909 Unspecified asthma, uncomplicated: Secondary | ICD-10-CM | POA: Insufficient documentation

## 2019-05-26 DIAGNOSIS — F17210 Nicotine dependence, cigarettes, uncomplicated: Secondary | ICD-10-CM | POA: Insufficient documentation

## 2019-05-26 DIAGNOSIS — S89301A Unspecified physeal fracture of lower end of right fibula, initial encounter for closed fracture: Secondary | ICD-10-CM | POA: Insufficient documentation

## 2019-05-26 DIAGNOSIS — Z79899 Other long term (current) drug therapy: Secondary | ICD-10-CM | POA: Insufficient documentation

## 2019-05-26 DIAGNOSIS — M25572 Pain in left ankle and joints of left foot: Secondary | ICD-10-CM | POA: Insufficient documentation

## 2019-05-26 DIAGNOSIS — Z794 Long term (current) use of insulin: Secondary | ICD-10-CM | POA: Insufficient documentation

## 2019-05-26 DIAGNOSIS — Y92008 Other place in unspecified non-institutional (private) residence as the place of occurrence of the external cause: Secondary | ICD-10-CM | POA: Insufficient documentation

## 2019-05-26 DIAGNOSIS — E119 Type 2 diabetes mellitus without complications: Secondary | ICD-10-CM | POA: Insufficient documentation

## 2019-05-26 HISTORY — DX: Type 2 diabetes mellitus without complications: E11.9

## 2019-05-26 NOTE — ED Provider Notes (Addendum)
[facility]   CSN: [id number] Arrival date & time: 05/26/19  1253    History   Chief Complaint Chief Complaint  Patient presents with  . Ankle Pain    HPI Fredric DineYashika Fadden is a 48 y.o. female with history of asthma and diabetes presents today for bilateral ankle pain.  Patient reports that 3 days ago she was walking down her father's driveway when she stepped in a hole twisting and slipping.  Patient reports immediate pain to her ankles later right compared to left.  She describes her right ankle pain as a moderate intensity throbbing sensation constant worsened with ambulation and movement and slightly improved with rest and elevation.  She has noted moderate swelling to her right ankle mostly on the lateral side that has been constant since her fall.  Patient has been treating herself with a ankle brace and has been ambulating on her ankle since her injury.  Patient secondary concern today is left ankle pain she describes a mild throbbing sensation to the lateral malleolus without radiation worsened with ambulation and palpation and improved with rest and elevation.  She denies any additional concerns today denies wound, bleeding, numbness/weakness, tingling, knee pain, back pain, neck pain, head injury/loss of consciousness, blood thinner use, chest or abdominal pain or any concerns.     HPI  Past Medical History:  Diagnosis Date  . Asthma   . Diabetes mellitus without complication [facility](HCC)     Patient Active Problem List   Diagnosis Date Noted  . MDD (major depressive disorder), recurrent severe, without psychosis (HCC) 03/23/2019  . MDD (major depressive disorder) 03/23/2019    Past Surgical History:  Procedure Laterality Date  . EYE SURGERY    . TUBAL LIGATION       OB History   No obstetric history on file.      Home Medications    Prior to Admission medications   Medication Sig Start Date End Date Taking?  Authorizing Provider  albuterol (VENTOLIN HFA) 108 (90 Base) MCG/ACT inhaler Inhale 1-2 puffs into the lungs every 6 (six) hours as needed for wheezing or shortness of breath. 03/27/19   Malvin JohnsFarah, Brian, MD  FLUoxetine (PROZAC) 20 MG capsule Take 1 capsule (20 mg total) by mouth daily. 03/28/19   Malvin JohnsFarah, Brian, MD  gabapentin (NEURONTIN) 300 MG capsule Take 1 capsule (300 mg total) by mouth 3 (three) times daily. 03/27/19   Malvin JohnsFarah, Brian, MD  ibuprofen (ADVIL,MOTRIN) 800 MG tablet Take 1 tablet (800 mg total) by mouth 3 (three) times daily. Patient not taking: Reported on 03/23/2019 06/07/16   Mackuen, Courteney Lyn, MD  insulin aspart protamine- aspart (NOVOLOG MIX 70/30) (70-30) 100 UNIT/ML injection Inject 0.15 mLs (15 Units total) into the skin 2 (two) times daily with a meal. 03/27/19   Malvin JohnsFarah, Brian, MD  naproxen (NAPROSYN) 500 MG tablet Take 1 tablet (500 mg total) by mouth 2 (two) times daily. Patient not taking: Reported on 03/23/2019 10/27/17   Dietrich PatesKhatri, Hina, PA-C  Prenatal Vit-Fe Fumarate-FA (PRENATAL MULTIVITAMIN) TABS tablet Take 1 tablet by mouth daily. 03/28/19   Malvin JohnsFarah, Brian, MD  propranolol (INDERAL) 40 MG tablet Take 1 tablet (40 mg total) by mouth 2 (two) times daily. 03/27/19   Malvin JohnsFarah, Brian, MD    Family History Family History  Problem Relation Age of Onset  . Diabetes Other     Social History Social History   Tobacco Use  . Smoking status: Current Some Day Smoker  Packs/day: 0.50    Types: Cigarettes  . Smokeless tobacco: Never Used  Substance Use Topics  . Alcohol use: Yes    Comment: occ  . Drug use: Not Currently     Allergies   Patient has no known allergies.   Review of Systems Review of Systems Ten systems are reviewed and are negative for acute change except as noted in the HPI  Physical Exam Updated Vital Signs BP (!) 140/96 (BP Location: Right Arm)   Pulse 90   Temp 98.4 F (36.9 C) (Oral)   Resp 18   LMP 05/25/2019 (Exact Date)   SpO2 99%   Physical  Exam Constitutional:      General: She is not in acute distress.    Appearance: Normal appearance. She is well-developed. She is not ill-appearing or diaphoretic.  HENT:     Head: Normocephalic and atraumatic.     Right Ear: External ear normal.     Left Ear: External ear normal.     Nose: Nose normal.  Eyes:     General: Vision grossly intact. Gaze aligned appropriately.     Pupils: Pupils are equal, round, and reactive to light.  Neck:     Musculoskeletal: Normal range of motion.     Trachea: Trachea and phonation normal. No tracheal deviation.  Cardiovascular:     Rate and Rhythm: Normal rate and regular rhythm.     Pulses:          Dorsalis pedis pulses are 2+ on the right side and 2+ on the left side.       Posterior tibial pulses are 2+ on the right side and 2+ on the left side.  Pulmonary:     Effort: Pulmonary effort is normal. No respiratory distress.  Abdominal:     General: There is no distension.     Palpations: Abdomen is soft.     Tenderness: There is no abdominal tenderness. There is no guarding or rebound.  Musculoskeletal: Normal range of motion.     Comments: Moderate swelling to the right ankle without skin break.  Lateral malleolus tenderness to palpation without deformity.  Capillary refill and sensation intact to all 5 toes.  Pedal pulses intact and equal bilaterally.  Compartments soft to palpation.  Dorsi/plantar flexion and inversion/eversion intact with some increase in pain.  No pain of the right knee or hip with appropriate range of motion and strength. - No swelling of the left ankle, no sign of skin break.  Lateral malleolus tenderness to palpation without deformity. Capillary refill and sensation intact to all 5 toes.  Pedal pulses intact and equal bilaterally.  Compartments soft to palpation.  Dorsi/plantar flexion and inversion/eversion intact with some increase in pain.  No pain of the right knee or hip with appropriate range of motion and strength.   Feet:     Right foot:     Protective Sensation: 3 sites tested. 3 sites sensed.     Left foot:     Protective Sensation: 3 sites tested. 3 sites sensed.  Skin:    General: Skin is warm and dry.  Neurological:     Mental Status: She is alert.     GCS: GCS eye subscore is 4. GCS verbal subscore is 5. GCS motor subscore is 6.     Comments: Speech is clear and goal oriented, follows commands Major Cranial nerves without deficit, no facial droop Moves extremities without ataxia, coordination intact  Psychiatric:  Behavior: Behavior normal.      ED Treatments / Results  Labs (all labs ordered are listed, but only abnormal results are displayed) Labs Reviewed - No data to display  EKG None  Radiology Dg Ankle Complete Left  Result Date: 05/26/2019 CLINICAL DATA:  Lateral ankle pain after fall EXAM: LEFT ANKLE COMPLETE - 3+ VIEW COMPARISON:  None. FINDINGS: There is no evidence of fracture, dislocation, or joint effusion. There is no evidence of arthropathy or other focal bone abnormality. Soft tissues are unremarkable. IMPRESSION: Negative. Electronically Signed   By: Davina Poke M.D.   On: 05/26/2019 14:00   Dg Ankle Complete Right  Result Date: 05/26/2019 CLINICAL DATA:  Lateral ankle pain post fall. EXAM: RIGHT ANKLE - COMPLETE 3+ VIEW COMPARISON:  None. FINDINGS: Oblique fracture of the distal fibula with mild posterior displacement of the distal fracture fragment. Probable joint effusion. Question mild widening of the lateral ankle mortise. Soft tissue swelling about the lateral malleolus. IMPRESSION: 1. Oblique fracture of the distal fibula with mild posterior displacement of the distal fracture fragment. 2. Question mild widening of the lateral ankle mortise. Electronically Signed   By: Fidela Salisbury M.D.   On: 05/26/2019 14:01    Procedures Procedures (including critical care time)  Medications Ordered in ED Medications - No data to display   Initial  Impression / Assessment and Plan / ED Course  I have reviewed the triage vital signs and the nursing notes.  Pertinent labs & imaging results that were available during my care of the patient were reviewed by me and considered in my medical decision making (see chart for details).    DG right ankle:  IMPRESSION:  1. Oblique fracture of the distal fibula with mild posterior  displacement of the distal fracture fragment.  2. Question mild widening of the lateral ankle mortise.   DG left ankle:    IMPRESSION:  Negative.  - Imaging reviewed with Dr. Reather Converse, no indication for reduction here in the ED, patient to be stabilized, given crutches and referred to orthopedics. - Orthopedics applying posterior splint with stirrups, crutches given.  No open wounds, no indication for antibiotics at this time.  Patient reports Tdap is up-to-date in May of this year.  No sign of DVT, cellulitis, septic arthritis, compartment syndrome or other emergent processes at this time.  She will be given referral to on-call orthopedics Dr. Marcelino Scot and encouraged to call his office to schedule a follow-up appointment.  Splint applied by orthopedic technician, patient reports feels comfortable and has no complaints.  Capillary refill and sensation and movement of the toes intact post application.  At this time there does not appear to be any evidence of an acute emergency medical condition and the patient appears stable for discharge with appropriate outpatient follow up. Diagnosis was discussed with patient who verbalizes understanding of care plan and is agreeable to discharge. I have discussed return precautions with patient who verbalizes understanding of return precautions. Patient encouraged to follow-up with their PCP. All questions answered.  Patient's case discussed with Dr. Reather Converse who agrees with plan to discharge with follow-up.   Note: Portions of this report may have been transcribed using voice recognition  software. Every effort was made to ensure accuracy; however, inadvertent computerized transcription errors may still be present. Final Clinical Impressions(s) / ED Diagnoses   Final diagnoses:  Closed fracture of distal end of right fibula, unspecified fracture morphology, initial encounter    ED Discharge Orders    None  Bill SalinasMorelli, Brandon A, PA-C 05/26/19 1609    Bill SalinasMorelli, Brandon A, PA-C 05/26/19 1610    Blane OharaZavitz, Joshua, MD 05/27/19 [phone]

## 2019-05-26 NOTE — Progress Notes (Signed)
Orthopedic Tech Progress Note Patient Details:  Yolanda Hendricks 1971-07-19 [id number]  Ortho Devices Type of Ortho Device: Crutches, Post (short leg) splint, Stirrup splint Ortho Device/Splint Location: rle Ortho Device/Splint Interventions: Ordered, Application, Adjustment   Post Interventions Patient Tolerated: Well Instructions Provided: Care of device, Adjustment of device   Karolee Stamps 05/26/2019, 4:14 PM

## 2019-05-26 NOTE — ED Notes (Signed)
Ortho tech notified of splint order 

## 2019-05-26 NOTE — ED Notes (Signed)
Patient transported to X-ray 

## 2019-05-26 NOTE — Discharge Instructions (Addendum)
You have been diagnosed today with oblique fracture of the distal right fibula.  At this time there does not appear to be the presence of an emergent medical condition, however there is always the potential for conditions to change. Please read and follow the below instructions.  Please return to the Emergency Department immediately for any new or worsening symptoms. Please be sure to follow up with your Primary Care Provider within one week regarding your visit today; please call their office to schedule an appointment even if you are feeling better for a follow-up visit. Use the crutches and avoid putting weight on your right ankle to avoid further injury.  Please call the orthopedic specialist Dr. Marcelino Scot to schedule a follow-up appointment regarding your right ankle fracture.  Please use rest, ice and elevation to help with pain and swelling.  Get help right away if: Your cast gets damaged. You continue to have very bad pain. You have new pain or swelling. Your skin or toes below the injured ankle: Turn blue or gray. Feel cold or numb. Lose sensitivity to touch. Your pain gets worse. The injured area tingles, gets numb, or turns blue and cold. The part of your body above or below the cast is swollen and it turns a different color (is discolored). You cannot feel or move your fingers or toes. There is fluid leaking through the cast. You have very bad pain or pressure under the cast. You have trouble breathing. You have shortness of breath. You have chest pain. Any new/concerning or worsening symptoms  Please read the additional information packets attached to your discharge summary.

## 2019-05-26 NOTE — ED Triage Notes (Signed)
Pt fell in a gravel driveway 4 days ago by accident and now has bilateral ankle pain. Worse on the right with swelling down the foot. Pt has DM but no skin break down. VSS.

## 2019-05-26 NOTE — ED Notes (Signed)
To x-ray

## 2019-06-06 ENCOUNTER — Encounter: Payer: Self-pay | Admitting: Family Medicine

## 2019-06-06 ENCOUNTER — Ambulatory Visit (INDEPENDENT_AMBULATORY_CARE_PROVIDER_SITE_OTHER): Payer: Self-pay | Admitting: Family Medicine

## 2019-06-06 ENCOUNTER — Other Ambulatory Visit: Payer: Self-pay

## 2019-06-06 VITALS — BP 108/75 | HR 88 | Temp 98.7°F | Ht 67.5 in | Wt 161.0 lb

## 2019-06-06 DIAGNOSIS — Z0000 Encounter for general adult medical examination without abnormal findings: Secondary | ICD-10-CM

## 2019-06-06 DIAGNOSIS — S82831D Other fracture of upper and lower end of right fibula, subsequent encounter for closed fracture with routine healing: Secondary | ICD-10-CM

## 2019-06-06 DIAGNOSIS — E119 Type 2 diabetes mellitus without complications: Secondary | ICD-10-CM

## 2019-06-06 DIAGNOSIS — F332 Major depressive disorder, recurrent severe without psychotic features: Secondary | ICD-10-CM

## 2019-06-06 MED ORDER — GABAPENTIN 300 MG PO CAPS: 300.0000 mg | ORAL_CAPSULE | Freq: Three times a day (TID) | ORAL | 0 refills | Status: DC

## 2019-06-06 MED ORDER — INSULIN ASPART PROT & ASPART (70-30 MIX) 100 UNIT/ML ~~LOC~~ SUSP: 15.0000 [IU] | Freq: Two times a day (BID) | SUBCUTANEOUS | 1 refills | Status: DC

## 2019-06-06 MED ORDER — FLUOXETINE HCL 20 MG PO CAPS: 20.0000 mg | ORAL_CAPSULE | Freq: Every day | ORAL | 0 refills | Status: DC

## 2019-06-06 MED ORDER — PRENATAL MULTIVITAMIN CH: 1.0000 | ORAL_TABLET | Freq: Every day | ORAL | 0 refills | Status: DC

## 2019-06-06 MED ORDER — ALBUTEROL SULFATE HFA 108 (90 BASE) MCG/ACT IN AERS: 1.0000 | INHALATION_SPRAY | Freq: Four times a day (QID) | RESPIRATORY_TRACT | 0 refills | Status: DC | PRN

## 2019-06-06 MED ORDER — PROPRANOLOL HCL 40 MG PO TABS: 40.0000 mg | ORAL_TABLET | Freq: Two times a day (BID) | ORAL | 0 refills | Status: DC

## 2019-06-06 NOTE — Patient Instructions (Signed)
It was great meeting you today!  I am sorry that your right leg fracture.  I will make a referral to orthopedics.  You should hear from them in a week or 2.  [facility], see get these filled.  Please come back and see me in 3 or 4 weeks.  We can check your hemoglobin A1c, and address some health maintenance items.

## 2019-06-09 DIAGNOSIS — Z0000 Encounter for general adult medical examination without abnormal findings: Secondary | ICD-10-CM | POA: Insufficient documentation

## 2019-06-09 DIAGNOSIS — E119 Type 2 diabetes mellitus without complications: Secondary | ICD-10-CM | POA: Insufficient documentation

## 2019-06-09 DIAGNOSIS — E1165 Type 2 diabetes mellitus with hyperglycemia: Secondary | ICD-10-CM | POA: Insufficient documentation

## 2019-06-09 DIAGNOSIS — S82401A Unspecified fracture of shaft of right fibula, initial encounter for closed fracture: Secondary | ICD-10-CM | POA: Insufficient documentation

## 2019-06-09 NOTE — Assessment & Plan Note (Signed)
Patient twisted ankle and has known distal fibular fracture.  Has brace in place.  Walking on leg, unclear if she is nonweightbearing status.  Needs follow-up with orthopedist, referral placed.

## 2019-06-09 NOTE — Progress Notes (Signed)
  HPI:  Patient presents today for a new patient appointment to establish general primary care, also to discuss right leg fracture.   ROS: See HPI  Past Medical Hx:  -Type 2 diabetes -Asthma -Major depressive sorter  Past Surgical Hx:  -None  Family Hx: updated in Epic  Social Hx: Lives with mother.  Unemployed.  Drinks "1 40 oz week ".  Health Maintenance:  -Needs Tdap, Pap smear, HIV  PHYSICAL EXAM: BP 108/75   Pulse 88   Temp 98.7 F (37.1 C) (Oral)   Ht 5' 7.5" (1.715 m)   Wt 161 lb (73 kg)   LMP 05/25/2019 (Exact Date)   SpO2 98%   BMI 24.84 kg/m  Gen: 48 year old Caucasian female, no acute distress, resting comfortably HEENT: EOMI, PERRLA.  Mild cerumen impaction bilaterally.  No bulging tympanic membrane bilaterally, no erythematous ear canal bilaterally.  No palpable cervical lymphadenopathy. Heart: Regular rate rhythm, no M/R/G.  Palpable radial pulse bilaterally.  Skin warm and dry Lungs: Lungs clear to auscultation bilaterally, no accessory muscle use, able speak in clear coherent sentences with no issue Abdomen: Soft and nontender, nondistended. Neuro: CN II through XII intact, no focal neurologic deficit.  Gait abnormality secondary to broken right fibula. MSK: Brace in place over her right ankle.  Mild amount of swelling and bruising still noted right ankle.  5 out of strength all muscle groups bilateral upper extremity, bilateral lower extremity aside from right ankle flexion.  ASSESSMENT/PLAN:  # Health maintenance:  -Needs Pap smear, Tdap at subsequent visits  Right fibular fracture Patient twisted ankle and has known distal fibular fracture.  Has brace in place.  Walking on leg, unclear if she is nonweightbearing status.  Needs follow-up with orthopedist, referral placed.  Type II diabetes mellitus (HCC) A1c 10.7 back on 03/24/2019.  Takes 7030 mix 15 units for breakfast.  States that she sometimes takes a second dose for dinner.  We will have her  come back in a few weeks and recheck A1c then.  Likely a good candidate for oral therapy on top of existing insulin regimen.  MDD (major depressive disorder), recurrent severe, without psychosis ([place]) Seen by [facility] for management of this issue.  Taking fluoxetine 20 mg daily.  States that she has been "a good place".  PHQ 9 reviewed, with no concerning findings.  Healthcare maintenance Overdue for Tdap, HIV, Pap smear.  HIV drawn at this appointment and was negative.  Will perform Pap smear and give prescription for Tdap to get health department in next visit.     FOLLOW UP: Follow up in 2 or 3 weeks for diabetes management  Guadalupe Dawn MD PGY-3 Family Medicine Resident

## 2019-06-09 NOTE — Assessment & Plan Note (Signed)
Overdue for Tdap, HIV, Pap smear.  HIV drawn at this appointment and was negative.  Will perform Pap smear and give prescription for Tdap to get health department in next visit.

## 2019-06-09 NOTE — Assessment & Plan Note (Addendum)
A1c 10.7 back on 03/24/2019.  Takes 7030 mix 15 units for breakfast.  States that she sometimes takes a second dose for dinner.  We will have her come back in a few weeks and recheck A1c then.  Likely a good candidate for oral therapy on top of existing insulin regimen.

## 2019-06-09 NOTE — Assessment & Plan Note (Signed)
Seen by [facility] for management of this issue.  Taking fluoxetine 20 mg daily.  States that she has been "a good place".  PHQ 9 reviewed, with no concerning findings.

## 2019-06-30 ENCOUNTER — Ambulatory Visit: Payer: Self-pay

## 2019-06-30 ENCOUNTER — Other Ambulatory Visit: Payer: Self-pay

## 2019-06-30 ENCOUNTER — Ambulatory Visit: Payer: Self-pay | Admitting: Family Medicine

## 2019-07-12 ENCOUNTER — Ambulatory Visit (INDEPENDENT_AMBULATORY_CARE_PROVIDER_SITE_OTHER): Payer: Self-pay | Admitting: Family Medicine

## 2019-07-12 ENCOUNTER — Other Ambulatory Visit: Payer: Self-pay

## 2019-07-12 VITALS — BP 124/80 | Wt 157.0 lb

## 2019-07-12 DIAGNOSIS — Z0000 Encounter for general adult medical examination without abnormal findings: Secondary | ICD-10-CM

## 2019-07-12 DIAGNOSIS — F332 Major depressive disorder, recurrent severe without psychotic features: Secondary | ICD-10-CM

## 2019-07-12 DIAGNOSIS — I159 Secondary hypertension, unspecified: Secondary | ICD-10-CM

## 2019-07-12 DIAGNOSIS — E119 Type 2 diabetes mellitus without complications: Secondary | ICD-10-CM

## 2019-07-12 DIAGNOSIS — S82831D Other fracture of upper and lower end of right fibula, subsequent encounter for closed fracture with routine healing: Secondary | ICD-10-CM

## 2019-07-12 LAB — POCT GLYCOSYLATED HEMOGLOBIN (HGB A1C): HbA1c, POC (controlled diabetic range): 7.9 % — AB (ref 0.0–7.0)

## 2019-07-12 MED ORDER — FLUOXETINE HCL 20 MG PO CAPS: 20.0000 mg | ORAL_CAPSULE | Freq: Every day | ORAL | 0 refills | Status: DC

## 2019-07-12 MED ORDER — ALBUTEROL SULFATE HFA 108 (90 BASE) MCG/ACT IN AERS: 1.0000 | INHALATION_SPRAY | Freq: Four times a day (QID) | RESPIRATORY_TRACT | 0 refills | Status: DC | PRN

## 2019-07-12 MED ORDER — LISINOPRIL 5 MG PO TABS: 5.0000 mg | ORAL_TABLET | Freq: Every day | ORAL | 0 refills | Status: DC

## 2019-07-12 MED ORDER — METFORMIN HCL 500 MG PO TABS: 500.0000 mg | ORAL_TABLET | Freq: Two times a day (BID) | ORAL | 0 refills | Status: DC

## 2019-07-12 MED ORDER — GABAPENTIN 300 MG PO CAPS: 300.0000 mg | ORAL_CAPSULE | Freq: Three times a day (TID) | ORAL | 0 refills | Status: DC

## 2019-07-12 NOTE — Patient Instructions (Signed)
It was great seeing you again today!  Your A1c has improved dramatically from last visit which is good.  It is 7.9 from 10.7 today.  I think given your improvement we do not need to do the insulin anymore and can is to get away with metformin.  We will start at 500 mg 2 times per day.  For your blood pressure I think that lisinopril with better control than the propranolol so we will stop that medication.  You will take lisinopril 5 mg every day.  We will get you a 30-day orange card today.  I would like to see you back in about 2 or 3 weeks to check your kidney function on lisinopril as well as address some of your health maintenance items.

## 2019-07-17 DIAGNOSIS — I10 Essential (primary) hypertension: Secondary | ICD-10-CM | POA: Insufficient documentation

## 2019-07-17 NOTE — Progress Notes (Addendum)
HPI 48 year old female who presents for diabetes management.  Patient states that her orange card ran out and she has not mailed pick up any of her supplies.  She has not been checking her sugars or using any insulin.  The patient's A1c has improved from 10.7-7.9.  She states that she has been eating better and getting some exercise.  Patient is also supposed to be on propranolol which she thinks was for high blood pressure.  She is also not been taking this states that she might also be taking it for anxiety.  Patient was has not been taking her prozac as she has not had it for around one month as her orange card ran out.  Patient was seen in the [place] ed for ankle pain. She was diagnosed with a fibular fracture and given a walking boot and crutches. She was supposed to follow up with orthopedics but did not 2/2 financial limitations. She states that her ankle still has a little aching but has otherwise returned to her baseline.  Patient was able to get a 30-day orange card during the visit.   CC: Diabetes management   ROS:   Review of Systems See HPI for ROS.   CC, SH/smoking status, and VS noted  Objective: BP 124/80   Wt 157 lb (71.2 kg)   SpO2 98%   BMI 24.23 kg/m  Gen: Very pleasant 48 year old female, no acute distress, resting comfortably CV: Regular rate and rhythm, no M/R/G Resp: Lungs clear to auscultation bilaterally, no accessory muscle use Neuro: Alert and oriented, Speech clear, No gross deficits Feet: Sensation intact bilaterally all distributions.  Palpable PT and DP bilaterally.   Assessment and plan:  Type II diabetes mellitus (HCC) A1c improved to 7.9 from 10.7.  We will switch from insulin to metformin.  Patient has not been taking any insulin for least 1 month.  Follow-up in 3 months for A1c.  Also switch to lisinopril 5 mg daily for renal protection.  Right fibular fracture Doing well from this standpoint.  Is able to ambulate with no problems  on her foot.  Never went to see orthopedics.  At this point the patient is greater than 2 months post fracture.  Can follow-up with orthopedics as needed.  MDD (major depressive disorder), recurrent severe, without psychosis (HCC) Doing quite well from the standpoint.  Has has not been taking her Prozac for the last month.  Did refill patient able to get at health apartment with orange card.  Recommended starting with half dose for first few days.  Healthcare maintenance Performed foot exam.  Hypertension BP 124/80.  Started on low-dose lisinopril 5 mg daily for renal protection.  To follow-up in 2 to 3 weeks for BMP check.   Orders Placed This Encounter  Procedures  . HgB A1c    Meds ordered this encounter  Medications  . albuterol (VENTOLIN HFA) 108 (90 Base) MCG/ACT inhaler    Sig: Inhale 1-2 puffs into the lungs every 6 (six) hours as needed for wheezing or shortness of breath.    Dispense:  18 g    Refill:  0  . FLUoxetine (PROZAC) 20 MG capsule    Sig: Take 1 capsule (20 mg total) by mouth daily.    Dispense:  90 capsule    Refill:  0  . gabapentin (NEURONTIN) 300 MG capsule    Sig: Take 1 capsule (300 mg total) by mouth 3 (three) times daily.    Dispense:  90 capsule  Refill:  0  . lisinopril (ZESTRIL) 5 MG tablet    Sig: Take 1 tablet (5 mg total) by mouth at bedtime.    Dispense:  90 tablet    Refill:  0  . metFORMIN (GLUCOPHAGE) 500 MG tablet    Sig: Take 1 tablet (500 mg total) by mouth 2 (two) times daily with a meal.    Dispense:  180 tablet    Refill:  0   Guadalupe Dawn MD PGY-3 Family Medicine Resident  07/17/2019 11:15 AM

## 2019-07-17 NOTE — Assessment & Plan Note (Signed)
Doing well from this standpoint.  Is able to ambulate with no problems on her foot.  Never went to see orthopedics.  At this point the patient is greater than 2 months post fracture.  Can follow-up with orthopedics as needed.

## 2019-07-17 NOTE — Assessment & Plan Note (Signed)
A1c improved to 7.9 from 10.7.  We will switch from insulin to metformin.  Patient has not been taking any insulin for least 1 month.  Follow-up in 3 months for A1c.  Also switch to lisinopril 5 mg daily for renal protection.

## 2019-07-17 NOTE — Assessment & Plan Note (Signed)
Performed foot exam.

## 2019-07-17 NOTE — Assessment & Plan Note (Signed)
BP 124/80.  Started on low-dose lisinopril 5 mg daily for renal protection.  To follow-up in 2 to 3 weeks for BMP check.

## 2019-07-17 NOTE — Assessment & Plan Note (Signed)
Doing quite well from the standpoint.  Has has not been taking her Prozac for the last month.  Did refill patient able to get at health apartment with orange card.  Recommended starting with half dose for first few days.

## 2019-08-07 ENCOUNTER — Ambulatory Visit: Payer: Self-pay | Admitting: Family Medicine

## 2019-10-24 ENCOUNTER — Encounter: Payer: Self-pay | Admitting: Family Medicine

## 2020-01-23 ENCOUNTER — Other Ambulatory Visit: Payer: Self-pay | Admitting: Family Medicine

## 2020-01-24 ENCOUNTER — Other Ambulatory Visit: Payer: Self-pay | Admitting: Family Medicine

## 2020-01-24 ENCOUNTER — Encounter (INDEPENDENT_AMBULATORY_CARE_PROVIDER_SITE_OTHER): Payer: Self-pay

## 2020-01-24 MED ORDER — LISINOPRIL 5 MG PO TABS: 5.0000 mg | ORAL_TABLET | Freq: Every day | ORAL | 0 refills | Status: DC

## 2020-01-24 MED ORDER — METFORMIN HCL 500 MG PO TABS: 500.0000 mg | ORAL_TABLET | Freq: Two times a day (BID) | ORAL | 0 refills | Status: DC

## 2020-01-24 MED ORDER — ALBUTEROL SULFATE HFA 108 (90 BASE) MCG/ACT IN AERS: 1.0000 | INHALATION_SPRAY | Freq: Four times a day (QID) | RESPIRATORY_TRACT | 0 refills | Status: DC | PRN

## 2020-01-24 MED ORDER — GABAPENTIN 300 MG PO CAPS: 300.0000 mg | ORAL_CAPSULE | Freq: Three times a day (TID) | ORAL | 0 refills | Status: DC

## 2020-01-24 MED ORDER — FLUOXETINE HCL 20 MG PO CAPS: 20.0000 mg | ORAL_CAPSULE | Freq: Every day | ORAL | 0 refills | Status: DC

## 2020-01-26 ENCOUNTER — Ambulatory Visit: Payer: Self-pay

## 2020-01-26 ENCOUNTER — Encounter: Payer: Self-pay | Admitting: Family Medicine

## 2020-01-28 ENCOUNTER — Encounter (INDEPENDENT_AMBULATORY_CARE_PROVIDER_SITE_OTHER): Payer: Self-pay

## 2020-02-01 ENCOUNTER — Ambulatory Visit: Payer: Self-pay | Attending: Internal Medicine

## 2020-02-01 DIAGNOSIS — Z23 Encounter for immunization: Secondary | ICD-10-CM

## 2020-02-01 NOTE — Progress Notes (Signed)
   Covid-19 Vaccination Clinic  Name:  [name]    MRN: [id number] DOB: 26-Jan-1971  02/01/2020  [name] was observed post Covid-19 immunization for 15 minutes without incident. She was provided with Vaccine Information Sheet and instruction to access the V-Safe system.   [name] was instructed to call 911 with any severe reactions post vaccine: Marland Kitchen Difficulty breathing  . Swelling of face and throat  . A fast heartbeat  . A bad rash all over body  . Dizziness and weakness   Immunizations Administered    Name Date Dose VIS Date Route   Pfizer COVID-19 Vaccine 02/01/2020  9:38 AM 0.3 mL 10/13/2019 Intramuscular   Manufacturer: [employer], [employer]   Lot: [id number]   NDC: [address]-8

## 2020-02-05 ENCOUNTER — Encounter: Payer: Self-pay | Admitting: Family Medicine

## 2020-02-05 ENCOUNTER — Other Ambulatory Visit: Payer: Self-pay

## 2020-02-05 ENCOUNTER — Ambulatory Visit (INDEPENDENT_AMBULATORY_CARE_PROVIDER_SITE_OTHER): Payer: Self-pay | Admitting: Family Medicine

## 2020-02-05 VITALS — BP 88/62 | HR 87 | Ht 67.5 in | Wt 142.2 lb

## 2020-02-05 DIAGNOSIS — L658 Other specified nonscarring hair loss: Secondary | ICD-10-CM

## 2020-02-05 DIAGNOSIS — I159 Secondary hypertension, unspecified: Secondary | ICD-10-CM

## 2020-02-05 DIAGNOSIS — F332 Major depressive disorder, recurrent severe without psychotic features: Secondary | ICD-10-CM

## 2020-02-05 DIAGNOSIS — E119 Type 2 diabetes mellitus without complications: Secondary | ICD-10-CM

## 2020-02-05 LAB — POCT GLYCOSYLATED HEMOGLOBIN (HGB A1C): HbA1c, POC (controlled diabetic range): 7.5 % — AB (ref 0.0–7.0)

## 2020-02-06 ENCOUNTER — Other Ambulatory Visit: Payer: Self-pay | Admitting: Family Medicine

## 2020-02-06 DIAGNOSIS — L659 Nonscarring hair loss, unspecified: Secondary | ICD-10-CM

## 2020-02-06 DIAGNOSIS — L658 Other specified nonscarring hair loss: Secondary | ICD-10-CM | POA: Insufficient documentation

## 2020-02-06 NOTE — Assessment & Plan Note (Signed)
Improved to 7.5 from 7.9.  Offered congratulations, patient states she has been trying to diet and exercise as much as possible.  Continue Metformin 500 mg twice daily.  Could potentially go up on this if we need to but given her improvement we will leave it

## 2020-02-06 NOTE — Assessment & Plan Note (Signed)
Unclear etiology of her hair loss.  Will get CBC, TSH.  Patient unable to go to lab as her ride had arrived and she had to go.  I placed these orders as future orders for lab visit.  Lisinopril has been linked hair loss in the past, so we will stop this as well.

## 2020-02-06 NOTE — Assessment & Plan Note (Signed)
Well-controlled on Prozac 20 mg.  PHQ-9 reviewed and was 11.  Patient states that she has been out of the Prozac for a few days and that she has felt the symptoms increase, but feels that with Prozac her symptoms are very well controlled.  Reviewed and patient has no thoughts of self-harm or harm of others.

## 2020-02-06 NOTE — Assessment & Plan Note (Signed)
BP 88/62.  Will stop lisinopril.  Could consider restarting at 2.5 mg for renal protection secondary to diabetes, but will stop for now.

## 2020-02-06 NOTE — Progress Notes (Signed)
   CHIEF COMPLAINT / HPI: 49 year old female who presents for diabetes follow-up.  A1c 7.5 at this appointment from 7.9 on 07/12/2019.  She states that she has been eating a little better and trying to get out and be active as much as possible.  Has been taking her Metformin 500 mg twice daily.  Patient's major concern is that she feels that her hair is falling out.  She states that when she runs her hand through her hair a lot of strands will come out with it.  She does feel that she has been under a lot of stress recently due to finances and Covid.  PERTINENT  PMH / PSH:    OBJECTIVE: BP (!) 88/62   Pulse 87   Ht 5' 7.5" (1.715 m)   Wt 142 lb 3.2 oz (64.5 kg)   SpO2 98%   BMI 21.94 kg/m   Gen: 49 year old Caucasian female, no acute distress, very pleasant HEENT: Symmetric corneal reflection bilaterally, EOMI, PERRLA CV: Skin warm and dry Resp: No accessory muscle use, no respiratory stress Neuro: Alert and oriented, Speech clear, No gross deficits   ASSESSMENT / PLAN:  Type II diabetes mellitus (HCC) Improved to 7.5 from 7.9.  Offered congratulations, patient states she has been trying to diet and exercise as much as possible.  Continue Metformin 500 mg twice daily.  Could potentially go up on this if we need to but given her improvement we will leave it  Hypertension BP 88/62.  Will stop lisinopril.  Could consider restarting at 2.5 mg for renal protection secondary to diabetes, but will stop for now.  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  Will get CBC, TSH.  Patient unable to go to lab as her ride had arrived and she had to go.  I placed these orders as future orders for lab visit.  Lisinopril has been linked hair loss in the past, so we will stop this as well.  MDD (major depressive disorder), recurrent severe, without psychosis (HCC) Well-controlled on Prozac 20 mg.  PHQ-9 reviewed and was 11.  Patient states that she has been out of the Prozac for a few  days and that she has felt the symptoms increase, but feels that with Prozac her symptoms are very well controlled.  Reviewed and patient has no thoughts of self-harm or harm of others.     Myrene Buddy MD PGY-3 Family Medicine Resident [facility]

## 2020-02-06 NOTE — Progress Notes (Signed)
Tsh, cbc, and bmp ordered for future lab visit  Myrene Buddy MD PGY-3 Family Medicine Resident

## 2020-02-26 ENCOUNTER — Ambulatory Visit: Payer: Self-pay | Attending: Internal Medicine

## 2020-02-26 DIAGNOSIS — Z23 Encounter for immunization: Secondary | ICD-10-CM

## 2020-02-26 NOTE — Progress Notes (Signed)
   Covid-19 Vaccination Clinic  Name:  Yolanda Hendricks    MRN: [id number] DOB: [birth year]  02/26/2020  [name] was observed post Covid-19 immunization for 15 minutes without incident. She was provided with Vaccine Information Sheet and instruction to access the V-Safe system.   [name] was instructed to call 911 with any severe reactions post vaccine: Marland Kitchen Difficulty breathing  . Swelling of face and throat  . A fast heartbeat  . A bad rash all over body  . Dizziness and weakness   Immunizations Administered    Name Date Dose VIS Date Route   Pfizer COVID-19 Vaccine 02/26/2020  1:53 PM 0.3 mL 12/27/2018 Intramuscular   Manufacturer: [employer], [employer]   Lot: [id number]   NDC: [address]-1

## 2020-02-28 ENCOUNTER — Encounter: Payer: Self-pay | Admitting: Family Medicine

## 2020-03-01 ENCOUNTER — Encounter: Payer: Self-pay | Admitting: Family Medicine

## 2020-04-24 ENCOUNTER — Encounter: Payer: Self-pay | Admitting: Family Medicine

## 2020-04-29 ENCOUNTER — Other Ambulatory Visit: Payer: Self-pay | Admitting: Family Medicine

## 2020-04-29 MED ORDER — TRAZODONE HCL 50 MG PO TABS: 50.0000 mg | ORAL_TABLET | Freq: Every day | ORAL | 0 refills | Status: DC

## 2020-04-29 NOTE — Progress Notes (Signed)
Sent in 90 day supply of trazodone  Myrene Buddy MD PGY-3 Family Medicine Resident

## 2020-05-16 ENCOUNTER — Other Ambulatory Visit: Payer: Self-pay | Admitting: Family Medicine

## 2020-05-16 MED ORDER — METFORMIN HCL 500 MG PO TABS: 500.0000 mg | ORAL_TABLET | Freq: Two times a day (BID) | ORAL | 0 refills | Status: DC

## 2020-05-16 MED ORDER — GABAPENTIN 300 MG PO CAPS: 300.0000 mg | ORAL_CAPSULE | Freq: Three times a day (TID) | ORAL | 0 refills | Status: DC

## 2020-05-16 MED ORDER — FLUOXETINE HCL 20 MG PO CAPS: 20.0000 mg | ORAL_CAPSULE | Freq: Every day | ORAL | 0 refills | Status: DC

## 2020-06-04 ENCOUNTER — Other Ambulatory Visit: Payer: Self-pay | Admitting: Family Medicine

## 2020-06-04 MED ORDER — TRAZODONE HCL 50 MG PO TABS: 50.0000 mg | ORAL_TABLET | Freq: Every day | ORAL | 0 refills | Status: DC

## 2020-06-14 ENCOUNTER — Encounter (HOSPITAL_COMMUNITY): Payer: Self-pay

## 2020-06-14 ENCOUNTER — Other Ambulatory Visit: Payer: Self-pay

## 2020-06-14 ENCOUNTER — Emergency Department (HOSPITAL_COMMUNITY): Payer: Self-pay

## 2020-06-14 ENCOUNTER — Emergency Department (HOSPITAL_COMMUNITY)
Admission: EM | Admit: 2020-06-14 | Discharge: 2020-06-14 | Disposition: A | Discharge status: Home / Self Care | Payer: Self-pay | Attending: Emergency Medicine | Admitting: Emergency Medicine

## 2020-06-14 DIAGNOSIS — Y998 Other external cause status: Secondary | ICD-10-CM | POA: Insufficient documentation

## 2020-06-14 DIAGNOSIS — S93491A Sprain of other ligament of right ankle, initial encounter: Secondary | ICD-10-CM

## 2020-06-14 DIAGNOSIS — Y9389 Activity, other specified: Secondary | ICD-10-CM | POA: Insufficient documentation

## 2020-06-14 DIAGNOSIS — J45909 Unspecified asthma, uncomplicated: Secondary | ICD-10-CM | POA: Insufficient documentation

## 2020-06-14 DIAGNOSIS — Y9289 Other specified places as the place of occurrence of the external cause: Secondary | ICD-10-CM | POA: Insufficient documentation

## 2020-06-14 DIAGNOSIS — S93431A Sprain of tibiofibular ligament of right ankle, initial encounter: Secondary | ICD-10-CM | POA: Insufficient documentation

## 2020-06-14 DIAGNOSIS — F17210 Nicotine dependence, cigarettes, uncomplicated: Secondary | ICD-10-CM | POA: Insufficient documentation

## 2020-06-14 DIAGNOSIS — E119 Type 2 diabetes mellitus without complications: Secondary | ICD-10-CM | POA: Insufficient documentation

## 2020-06-14 DIAGNOSIS — Z7984 Long term (current) use of oral hypoglycemic drugs: Secondary | ICD-10-CM | POA: Insufficient documentation

## 2020-06-14 DIAGNOSIS — X58XXXA Exposure to other specified factors, initial encounter: Secondary | ICD-10-CM | POA: Insufficient documentation

## 2020-06-14 DIAGNOSIS — Z79899 Other long term (current) drug therapy: Secondary | ICD-10-CM | POA: Insufficient documentation

## 2020-06-14 NOTE — Discharge Instructions (Addendum)
Your mechanism of injury and exam is consistent with a sprain of the anterior talofibular ligament of right ankle.  Fortunately, your imaging did not demonstrate any fractures or other bony abnormalities.    However, these injuries can be very painful and lead to difficulty walking for extended periods of time.  Please read the attachment on ankle sprains.  I would like for you to wear the crutches and brace that we have provided you regularly until your symptoms begin to improve.  Weightbearing as tolerated.  Please keep the affected foot elevated to avoid further swelling.  Take ibuprofen or Tylenol as needed for symptoms of pain.  You can follow-up with your primary care provider regarding today's encounter.  However, if your symptoms fail to improve with this conservative treatment, you can reach out to orthopedist Dr. Shon Baton at [facility].  Return to the ED or seek immediate medical attention should experience any new or worsening symptoms.

## 2020-06-14 NOTE — ED Provider Notes (Addendum)
[facility]-EMERGENCY DEPT Provider Note   CSN: [id number] Arrival date & time: 06/14/20  2025     History Chief Complaint  Patient presents with  . Ankle Pain    Yolanda Hendricks is a 49 y.o. female with PMH significant for type II DM and oblique fracture of the distal right fibula with mild posterior displacement sustained 05/23/2019 who presents to the ED with complaints of right ankle pain.  Patient had been referred for orthopedic follow-up to Dr. Carola Frost, however patient only followed up with her primary care provider, Dr. Primitivo Gauze.  She was ambulatory 2 months post injury.  On my examination, patient tells me that she was drinking alcohol last night and was leaving the house when she stepped off of a curb awkwardly and rolled her right ankle.  She describes an inversion injury.  She states that this morning, her ankle was significantly swollen and she was particularly tender over the outside of her foot.  She has a boot at home already which made her symptoms of discomfort more manageable.  She does not have any crutches at this time.  She denies falling to the ground or sustaining any other injury.  She also denies any numbness, weakness, or inability to move foot/ankle.  Patient works at [employer] and states that she is on her feet all day.   HPI     Past Medical History:  Diagnosis Date  . Asthma   . Diabetes mellitus without complication [facility])     Patient Active Problem List   Diagnosis Date Noted  . 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  . Hypertension 07/17/2019  . Type II diabetes mellitus (HCC) 06/09/2019  . Healthcare maintenance 06/09/2019  . MDD (major depressive disorder), recurrent severe, without psychosis (HCC) 03/23/2019    Past Surgical History:  Procedure Laterality Date  . EYE SURGERY    . TUBAL LIGATION       OB History   No obstetric history on file.     Family History  Problem Relation Age of Onset  . Diabetes Other      Social History   Tobacco Use  . Smoking status: Current Some Day Smoker    Packs/day: 0.50    Types: Cigarettes  . Smokeless tobacco: Never Used  Substance Use Topics  . Alcohol use: Yes    Comment: occ  . Drug use: Not Currently    Home Medications Prior to Admission medications   Medication Sig Start Date End Date Taking? Authorizing Provider  albuterol (VENTOLIN HFA) 108 (90 Base) MCG/ACT inhaler Inhale 1-2 puffs into the lungs every 6 (six) hours as needed for wheezing or shortness of breath. 01/24/20   Myrene Buddy, MD  FLUoxetine (PROZAC) 20 MG capsule Take 1 capsule (20 mg total) by mouth daily. 05/16/20   Bethena Midget, DO  gabapentin (NEURONTIN) 300 MG capsule Take 1 capsule (300 mg total) by mouth 3 (three) times daily. Please make appt before next refill 05/16/20   Bethena Midget, DO  metFORMIN (GLUCOPHAGE) 500 MG tablet Take 1 tablet (500 mg total) by mouth 2 (two) times daily with a meal. Need an appointment before next refill. 05/16/20   Bethena Midget, DO  traZODone (DESYREL) 50 MG tablet Take 1 tablet (50 mg total) by mouth at bedtime. 06/04/20   Moses Manners, MD    Allergies    Patient has no known allergies.  Review of Systems   Review of Systems  Constitutional: Negative for fever.  Musculoskeletal: Positive  for arthralgias, gait problem and joint swelling.  Skin: Negative for wound.  Neurological: Negative for weakness and numbness.    Physical Exam Updated Vital Signs BP (!) 144/87   Pulse (!) 101   Temp 98.5 F (36.9 C) (Oral)   Resp 18   SpO2 96%   Physical Exam Vitals and nursing note reviewed. Exam conducted with a chaperone present.  Constitutional:      General: She is not in acute distress.    Appearance: Normal appearance. She is not ill-appearing.  HENT:     Head: Normocephalic and atraumatic.  Eyes:     General: No scleral icterus.    Conjunctiva/sclera: Conjunctivae normal.  Cardiovascular:     Rate and Rhythm:  Normal rate and regular rhythm.     Pulses: Normal pulses.     Heart sounds: Normal heart sounds.  Pulmonary:     Effort: Pulmonary effort is normal. No respiratory distress.     Breath sounds: Normal breath sounds.  Musculoskeletal:     Comments: Right ankle: Swelling involving lateral malleolus.  TTP most notably over proximal aspect 5th metatarsal.  Negative Thompson's test and no Achilles tendon TTP.  Able to flex and dorsiflex, albeit limited due to pain and inflammation.  Pedal pulse intact.  Can wiggle toes.  Sensation intact throughout.  Compartments are soft. Right knee: Normal.  No TTP.  Strength against resistance. Right hip: Normal.  No TTP.  Strength against resistance.  Skin:    General: Skin is dry.     Capillary Refill: Capillary refill takes less than 2 seconds.  Neurological:     Mental Status: She is alert.     GCS: GCS eye subscore is 4. GCS verbal subscore is 5. GCS motor subscore is 6.  Psychiatric:        Mood and Affect: Mood normal.        Behavior: Behavior normal.        Thought Content: Thought content normal.     ED Results / Procedures / Treatments   Labs (all labs ordered are listed, but only abnormal results are displayed) Labs Reviewed - No data to display  EKG None  Radiology DG Ankle Complete Right  Result Date: 06/14/2020 CLINICAL DATA:  Ankle pain EXAM: RIGHT ANKLE - COMPLETE 3+ VIEW COMPARISON:  05/26/2019 FINDINGS: Lateral soft tissue swelling. Healing distal fibular fracture. No acute fracture, subluxation or dislocation. Joint spaces maintained. IMPRESSION: Healing distal fibular fracture.  Lateral ankle swelling. Electronically Signed   By: Charlett Nose M.D.   On: 06/14/2020 22:04   DG Foot Complete Right  Result Date: 06/14/2020 CLINICAL DATA:  Fifth metatarsal phalangeal tenderness to palpation. Patient reports left fibular fracture 1 month ago. Twisted ankle stepping off curb last night. Swelling. EXAM: RIGHT FOOT COMPLETE - 3+ VIEW  COMPARISON:  Concurrent ankle radiograph. FINDINGS: There is no evidence of foot fracture or dislocation. Particularly, the fifth metatarsal is intact. There is no evidence of arthropathy or other focal bone abnormality. Soft tissues are unremarkable. Distal fibula better assessed on concurrent ankle exam. IMPRESSION: No fracture of the right foot. Particularly, no fracture of the fifth ray. Electronically Signed   By: Narda Rutherford M.D.   On: 06/14/2020 22:06    Procedures Procedures (including critical care time)  Medications Ordered in ED Medications - No data to display  ED Course  I have reviewed the triage vital signs and the nursing notes.  Pertinent labs & imaging results that were available during my  care of the patient were reviewed by me and considered in my medical decision making (see chart for details).    MDM Rules/Calculators/A&P                          Plain films obtained of ankle and foot are personally reviewed and demonstrate no evidence of acute fracture or other osseous abnormalities.  Patient's history, mechanism of injury, and physical exam is consistent with ATFL sprain.   Will provide ASO brace and crutches.  She endorses a history of substance abuse and is agreeable to treating her pain symptoms with ibuprofen and Tylenol as needed.  Patient will follow-up with her primary care provider regarding today's encounter.  I will also refer her to orthopedics should she continue experience worsening symptoms despite conservative management.    SPLINT APPLICATION Date/Time: 10:56 PM Authorized by: Evelena Leyden Consent: Verbal consent obtained. Risks and benefits: risks, benefits and alternatives were discussed Consent given by: patient Splint applied by: RN Location details: Right ankle Splint type: ASO Supplies used: ASO  Post-procedure: The splinted body part was neurovascularly unchanged following the procedure. Patient tolerance: Patient tolerated the  procedure well with no immediate complications.  All of the evaluation and work-up results were discussed with the patient and any family at bedside.  Patient and/or family were informed that while patient is appropriate for discharge at this time, some medical emergencies may only develop or become detectable after a period of time.  I specifically instructed patient and/or family to return to return to the ED or seek immediate medical attention for any new or worsening symptoms.  They were provided opportunity to ask any additional questions and have none at this time.  Prior to discharge patient is feeling well, agreeable with plan for discharge home.  They have expressed understanding of verbal discharge instructions as well as return precautions and are agreeable to the plan.    Final Clinical Impression(s) / ED Diagnoses Final diagnoses:  Sprain of anterior talofibular ligament of right ankle, initial encounter    Rx / DC Orders ED Discharge Orders    None       Lorelee New, PA-C 06/14/20 2233    Lorelee New, PA-C 06/14/20 2257    Pollyann Savoy, MD 06/14/20 2315

## 2020-06-14 NOTE — ED Triage Notes (Signed)
Pt reports fracturing her fibula about a month ago. Pt reports twisting her ankle stepping off a curb last night. Pt now endorses right ankle pain. Right ankle is notable swollen at this time.

## 2020-06-16 ENCOUNTER — Other Ambulatory Visit: Payer: Self-pay

## 2020-06-16 ENCOUNTER — Encounter (HOSPITAL_COMMUNITY): Payer: Self-pay

## 2020-06-16 ENCOUNTER — Emergency Department (HOSPITAL_COMMUNITY)
Admission: EM | Admit: 2020-06-16 | Discharge: 2020-06-16 | Disposition: A | Discharge status: Other Acute Inpatient Hospital | Payer: Self-pay | Attending: Emergency Medicine | Admitting: Emergency Medicine

## 2020-06-16 ENCOUNTER — Ambulatory Visit (HOSPITAL_COMMUNITY)
Admission: EM | Admit: 2020-06-16 | Discharge: 2020-06-16 | Disposition: A | Discharge status: Home / Self Care | Payer: No Payment, Other | Attending: Psychiatry | Admitting: Psychiatry

## 2020-06-16 DIAGNOSIS — Z915 Personal history of self-harm: Secondary | ICD-10-CM | POA: Diagnosis not present

## 2020-06-16 DIAGNOSIS — F1410 Cocaine abuse, uncomplicated: Secondary | ICD-10-CM | POA: Diagnosis not present

## 2020-06-16 DIAGNOSIS — J45909 Unspecified asthma, uncomplicated: Secondary | ICD-10-CM | POA: Insufficient documentation

## 2020-06-16 DIAGNOSIS — F329 Major depressive disorder, single episode, unspecified: Secondary | ICD-10-CM | POA: Diagnosis not present

## 2020-06-16 DIAGNOSIS — F332 Major depressive disorder, recurrent severe without psychotic features: Secondary | ICD-10-CM

## 2020-06-16 DIAGNOSIS — R45851 Suicidal ideations: Secondary | ICD-10-CM | POA: Insufficient documentation

## 2020-06-16 DIAGNOSIS — Z20822 Contact with and (suspected) exposure to covid-19: Secondary | ICD-10-CM | POA: Insufficient documentation

## 2020-06-16 DIAGNOSIS — I10 Essential (primary) hypertension: Secondary | ICD-10-CM | POA: Insufficient documentation

## 2020-06-16 DIAGNOSIS — Z79899 Other long term (current) drug therapy: Secondary | ICD-10-CM | POA: Insufficient documentation

## 2020-06-16 DIAGNOSIS — E119 Type 2 diabetes mellitus without complications: Secondary | ICD-10-CM | POA: Insufficient documentation

## 2020-06-16 DIAGNOSIS — F17210 Nicotine dependence, cigarettes, uncomplicated: Secondary | ICD-10-CM | POA: Insufficient documentation

## 2020-06-16 DIAGNOSIS — E876 Hypokalemia: Secondary | ICD-10-CM | POA: Insufficient documentation

## 2020-06-16 LAB — COMPREHENSIVE METABOLIC PANEL
ALT: 9 U/L (ref 0–44)
AST: 16 U/L (ref 15–41)
Albumin: 4.3 g/dL (ref 3.5–5.0)
Alkaline Phosphatase: 67 U/L (ref 38–126)
Anion gap: 11 (ref 5–15)
BUN: 11 mg/dL (ref 6–20)
CO2: 21 mmol/L — ABNORMAL LOW (ref 22–32)
Calcium: 9.4 mg/dL (ref 8.9–10.3)
Chloride: 106 mmol/L (ref 98–111)
Creatinine, Ser: 0.43 mg/dL — ABNORMAL LOW (ref 0.44–1.00)
GFR calc Af Amer: >60 mL/min (ref >60)
GFR calc non Af Amer: >60 mL/min (ref >60)
Glucose, Bld: 122 mg/dL — ABNORMAL HIGH (ref 70–99)
Potassium: 3.4 mmol/L — ABNORMAL LOW (ref 3.5–5.1)
Sodium: 138 mmol/L (ref 135–145)
Total Bilirubin: 0.2 mg/dL — ABNORMAL LOW (ref 0.3–1.2)
Total Protein: 8.2 g/dL — ABNORMAL HIGH (ref 6.5–8.1)

## 2020-06-16 LAB — CBC
HCT: 44.5 % (ref 36.0–46.0)
Hemoglobin: 15 g/dL (ref 12.0–15.0)
MCH: 32.6 pg (ref 26.0–34.0)
MCHC: 33.7 g/dL (ref 30.0–36.0)
MCV: 96.7 fL (ref 80.0–100.0)
Platelets: 308 10*3/uL (ref 150–400)
RBC: 4.6 MIL/uL (ref 3.87–5.11)
RDW: 12.2 % (ref 11.5–15.5)
WBC: 9.8 10*3/uL (ref 4.0–10.5)
nRBC: 0 % (ref 0.0–0.2)

## 2020-06-16 LAB — RAPID URINE DRUG SCREEN, HOSP PERFORMED
Amphetamines: NOT DETECTED
Barbiturates: NOT DETECTED
Benzodiazepines: NOT DETECTED
Cocaine: POSITIVE — AB
Opiates: NOT DETECTED
Tetrahydrocannabinol: NOT DETECTED

## 2020-06-16 LAB — I-STAT BETA HCG BLOOD, ED (MC, WL, AP ONLY): I-stat hCG, quantitative: <5 m[IU]/mL (ref <5)

## 2020-06-16 LAB — ACETAMINOPHEN LEVEL: Acetaminophen (Tylenol), Serum: <10 ug/mL — ABNORMAL LOW (ref 10–30)

## 2020-06-16 LAB — CBG MONITORING, ED: Glucose-Capillary: 126 mg/dL — ABNORMAL HIGH (ref 70–99)

## 2020-06-16 LAB — ETHANOL: Alcohol, Ethyl (B): 123 mg/dL — ABNORMAL HIGH (ref <10)

## 2020-06-16 LAB — SARS CORONAVIRUS 2 BY RT PCR (HOSPITAL ORDER, PERFORMED IN ~~LOC~~ HOSPITAL LAB): SARS Coronavirus 2: NEGATIVE

## 2020-06-16 LAB — SALICYLATE LEVEL: Salicylate Lvl: <7 mg/dL — ABNORMAL LOW (ref 7.0–30.0)

## 2020-06-16 MED ORDER — POTASSIUM CHLORIDE CRYS ER 20 MEQ PO TBCR
40.0000 meq | EXTENDED_RELEASE_TABLET | Freq: Once | ORAL | Status: AC
  Administered 2020-06-16: 40 meq via ORAL
  Filled 2020-06-16: qty 2

## 2020-06-16 NOTE — Discharge Instructions (Addendum)
Substance abuse resources and Residential Options:  [facility] day residential substance abuse facility (not an option if you have active assault charges). [address], [place], [place] [address] Phone: [phone]: Ask for Shayla in admissions to complete intake if interested in pursuing this option.  [facility]: Can get intake scheduled; (not an option if you have active assault charges). [address] [place], [place] ([phone]) Call Mon-Fri.  [facility]): (offers outpatient therapy and intensive outpatient substance abuse therapy).  [address], [place], [place] [address] Phone: [phone]  [facility]: Offers FREE recovery skills classes, support groups, 1:1 Peer Support, and Compeer Classes. [address], [place], [place] [address] Phone: [phone] (Call to complete intake).   [facility] [address] [place], [place] [address] Phone: [phone] ext [phone]  [facility] provides food, shelter and other programs and services to the homeless men of [place] through our men's program.  By offering safe shelter, three meals a day, clean clothing, Biblical counseling, financial planning, vocational training, GED/education and employment assistance, we've helped mend the shattered lives of many homeless men since opening in 1974.  We have approximately 267 beds available, with a max of 312 beds including mats for emergency situations and currently house an average of 270 men a night.  Prospective Client Check-In Information Photo ID Required (State/ Out of State/ DOC) - if photo ID is not available, clients are required to have a printout of a police/sheriff's criminal history report. Help out with chores around the Mission. No sex offender of any type (pending, charged, registered and/or any other sex related offenses) will be permitted to check in. Must be  willing to abide by all rules, regulations, and policies established by the [facility]. The following will be provided - shelter, food, clothing, and biblical counseling. If you or someone you know is in need of assistance at our men's shelter in , [place], please call [phone] ext. [phone].   [facility] [address] [place], [place] [address] PHONE: [phone] Hours: Monday-Friday 8:00AM-3:00PM  This agency offers the following services for free:   *Integrated Care -Case management -[facility] Outreach -Medical clinic -Mental health nurse -Referrals  *Fundamental Services -Showers and hygiene supplies -Laundry -Phone access -Mailing addresses and mailboxes -Replacement IDs -Ons[employer] barbershop -Storage lockers -[place] winter warming center  *Self Sufficiency -Skilled trade classes -Job skills classes -Resume and jobs application assistance -Interview training -GED classes -Professional clothing vouchers -Financial literacy  
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
## 2020-06-16 NOTE — ED Provider Notes (Signed)
[facility]-EMERG[facility] DEPT Provider Note   CSN: [id number] Arrival date & time: 06/16/20  9233   History Chief Complaint  Patient presents with  . Suicidal    Yolanda Hendricks is a 49 y.o. female.  The history is provided by the patient.  She has history of asthma, diabetes, depression and comes in because of worsening depression with vague suicidal ideation.  She states that she has been depressed for a long time and has had thoughts of just wanting to end her life but no specific plans.  She does admit to alcohol and cocaine use.  She denies hallucinations.  She states she has been under a lot of stress recently.  She is status post tubal ligation.  She has received 2 doses of her COVID-19 vaccination.  Past Medical History:  Diagnosis Date  . Asthma   . Diabetes mellitus without complication [facility])     Patient Active Problem List   Diagnosis Date Noted  . 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  . Hypertension 07/17/2019  . Type II diabetes mellitus (HCC) 06/09/2019  . Healthcare maintenance 06/09/2019  . MDD (major depressive disorder), recurrent severe, without psychosis (HCC) 03/23/2019    Past Surgical History:  Procedure Laterality Date  . EYE SURGERY    . TUBAL LIGATION       OB History   No obstetric history on file.     Family History  Problem Relation Age of Onset  . Diabetes Other     Social History   Tobacco Use  . Smoking status: Current Some Day Smoker    Packs/day: 0.50    Types: Cigarettes  . Smokeless tobacco: Never Used  Substance Use Topics  . Alcohol use: Yes    Comment: occ  . Drug use: Not Currently    Home Medications Prior to Admission medications   Medication Sig Start Date End Date Taking? Authorizing Provider  albuterol (VENTOLIN HFA) 108 (90 Base) MCG/ACT inhaler Inhale 1-2 puffs into the lungs every 6 (six) hours as needed for wheezing or shortness of breath. 01/24/20   Myrene Buddy, MD  FLUoxetine  (PROZAC) 20 MG capsule Take 1 capsule (20 mg total) by mouth daily. 05/16/20   Bethena Midget, DO  gabapentin (NEURONTIN) 300 MG capsule Take 1 capsule (300 mg total) by mouth 3 (three) times daily. Please make appt before next refill 05/16/20   Bethena Midget, DO  metFORMIN (GLUCOPHAGE) 500 MG tablet Take 1 tablet (500 mg total) by mouth 2 (two) times daily with a meal. Need an appointment before next refill. 05/16/20   Bethena Midget, DO  traZODone (DESYREL) 50 MG tablet Take 1 tablet (50 mg total) by mouth at bedtime. 06/04/20   Moses Manners, MD    Allergies    Patient has no known allergies.  Review of Systems   Review of Systems  All other systems reviewed and are negative.   Physical Exam Updated Vital Signs BP (!) 121/101 (BP Location: Right Arm)   Pulse (!) 104   Temp 97.6 F (36.4 C) (Oral)   Resp 18   SpO2 100%   Physical Exam Vitals and nursing note reviewed.   49 year old female, resting comfortably and in no acute distress. Vital signs are significant for elevated diastolic blood pressure and borderline elevated heart rate. Oxygen saturation is 100%, which is normal. Head is normocephalic and atraumatic. PERRLA, EOMI. Oropharynx is clear. Neck is nontender and supple without adenopathy or JVD. Back is nontender and there  is no CVA tenderness. Lungs are clear without rales, wheezes, or rhonchi. Chest is nontender. Heart has regular rate and rhythm without murmur. Abdomen is soft, flat, nontender without masses or hepatosplenomegaly and peristalsis is normoactive. Extremities have no cyanosis or edema, full range of motion is present. Skin is warm and dry without rash. Neurologic: Mental status is normal, cranial nerves are intact, there are no motor or sensory deficits. Psychiatric: Depressed affect.  ED Results / Procedures / Treatments   Labs (all labs ordered are listed, but only abnormal results are displayed) Labs Reviewed  COMPREHENSIVE METABOLIC  PANEL - Abnormal; Notable for the following components:      Result Value   Potassium 3.4 (*)    CO2 21 (*)    Glucose, Bld 122 (*)    Creatinine, Ser 0.43 (*)    Total Protein 8.2 (*)    Total Bilirubin 0.2 (*)    All other components within normal limits  ETHANOL - Abnormal; Notable for the following components:   Alcohol, Ethyl (B) 123 (*)    All other components within normal limits  SALICYLATE LEVEL - Abnormal; Notable for the following components:   Salicylate Lvl <7.0 (*)    All other components within normal limits  ACETAMINOPHEN LEVEL - Abnormal; Notable for the following components:   Acetaminophen (Tylenol), Serum <10 (*)    All other components within normal limits  RAPID URINE DRUG SCREEN, HOSP PERFORMED - Abnormal; Notable for the following components:   Cocaine POSITIVE (*)    All other components within normal limits  CBG MONITORING, ED - Abnormal; Notable for the following components:   Glucose-Capillary 126 (*)    All other components within normal limits  SARS CORONAVIRUS 2 BY RT PCR (HOSPITAL ORDER, PERFORMED IN [facility])  CBC  I-STAT BETA HCG BLOOD, ED ([facility], [facility], [facility] ONLY)   Procedures Procedures   Medications Ordered in ED Medications  potassium chloride SA (KLOR-CON) CR tablet 40 mEq (has no administration in time range)    ED Course  I have reviewed the triage vital signs and the nursing notes.  Pertinent lab results that were available during my care of the patient were reviewed by me and considered in my medical decision making (see chart for details).  MDM Rules/Calculators/A&P Depression with vague suicidal ideation.  Case has been discussed with Nira Conn at [facility] who agrees to accept the patient pending negative Covid screen.  Initial labs do show a mild hypokalemia and she is given a dose of oral potassium.  Final Clinical Impression(s) / ED Diagnoses Final diagnoses:  Major depressive disorder without psychotic features    Suicidal ideation  Hypokalemia    Rx / DC Orders ED Discharge Orders    None       Dione Booze, MD 06/16/20 [phone]

## 2020-06-16 NOTE — Progress Notes (Signed)
Received Yolanda Hendricks in the interview room, she received her AVS and her questions answered. She has a fracture ankle and cannot wear a shoe on her left foot. She was transported by [employer] to her hotel room in the rain.

## 2020-06-16 NOTE — ED Provider Notes (Signed)
Behavioral Health Medical Screening Exam  Yolanda Hendricks is a 49 y.o. female.  Patient assessed in the emergency department prior to transport to [facility] behavioral health center for walk-in assessment.  Patient reports he uses alcohol and cocaine but would like to stop using these substances.  Patient's urine drug screen was positive for cocaine.  Patient reports use of cocaine and alcohol for approximately 1 year.  Patient reports drinking alcohol occasionally.  Patient reports using cocaine last on yesterday, reports smokes crack cocaine.  Patient reports she would like help with substance use and "someone to talk to."  Patient resides at the [facility] for approximately 1 year, hotel located in [place].  Patient denies access to weapons.  Patient reports he is employed at [employer].  Patient reports she has 2 adult children and other natural supports in the area however currently she cannot reside with any family therefore remains at hotel.  Patient reports she would like to move out of hotel because she feels this contributes to her substance use disorder.  Patient reports she resides at hotel with her boyfriend who "calls me crazy."  Patient reports she was seen by [facility] in the past for major depressive disorder.  Patient reports she has not seen psychiatry or had any medications in approximately 1 year.  Patient states "all I want to do is do the right thing, I do not want to be around drugs."  Patient reports she is very determined to continue working because "I want to keep my job so I can be independent."  Patient assessed by nurse practitioner.  Patient alert and oriented, participates appropriately in assessment.  Patient is pleasant and cooperative during assessment.  Patient denies suicidal ideations.  Patient reports history of suicide attempts "a long time ago."  Patient denies homicidal ideations.  Patient denies auditory visual hallucinations.  There is  no indication that the patient is responding to internal stimuli, and no evidence of delusional thought content.  Patient denies symptoms of paranoia.  Discussed following up with [facility] for psychiatric needs.  Discussed substance use treatment options.  Patient offered support and encouragement.  Total Time spent with patient: 20 minutes  Psychiatric Specialty Exam  Presentation  General Appearance:Appropriate for Environment;Casual  Eye Contact:Good  Speech:Clear and Coherent;Normal Rate  Speech Volume:Normal  Handedness:Right   Mood and Affect  Mood:Depressed  Affect:Tearful   Thought Process  Thought Processes:Coherent;Goal Directed  Descriptions of Associations:Intact  Orientation:Full (Time, Place and Person)  Thought Content:Logical;WDL  Hallucinations:None  Ideas of Reference:None  Suicidal Thoughts:No  Homicidal Thoughts:No   Sensorium  Memory:Immediate Good;Recent Good;Remote Good  Judgment:Fair  Insight:Fair   Executive Functions  Concentration:Fair  Attention Span:Good  Recall:Good  Fund of Knowledge:Good  Language:Good   Psychomotor Activity  Psychomotor Activity:Normal   Assets  Assets:Communication Skills;Desire for Improvement;Financial Resources/Insurance;Housing;Intimacy;Leisure Time;Physical Health;Resilience;Social Support   Sleep  Sleep:Fair  Number of hours: No data recorded  Physical Exam: Physical Exam Vitals and nursing note reviewed.  Constitutional:      Appearance: She is well-developed.  HENT:     Head: Normocephalic.  Cardiovascular:     Rate and Rhythm: Normal rate.  Pulmonary:     Effort: Pulmonary effort is normal.  Neurological:     Mental Status: She is alert and oriented to person, place, and time.  Psychiatric:        Attention and Perception: Attention and perception normal.        Mood and Affect:  Affect normal. Mood is depressed.        Speech: Speech  normal.        Behavior: Behavior normal. Behavior is cooperative.        Thought Content: Thought content normal.        Cognition and Memory: Cognition and memory normal.        Judgment: Judgment normal.    Review of Systems  Constitutional: Negative.   HENT: Negative.   Eyes: Negative.   Respiratory: Negative.   Cardiovascular: Negative.   Gastrointestinal: Negative.   Genitourinary: Negative.   Musculoskeletal: Negative.   Skin: Negative.   Neurological: Negative.   Endo/Heme/Allergies: Negative.   Psychiatric/Behavioral: Positive for depression and substance abuse.   Blood pressure 116/89, pulse 99, temperature 98.5 F (36.9 C), temperature source Oral, resp. rate 16, height 5\' 7"  (1.702 m), weight 68.9 kg, SpO2 100 %. Body mass index is 23.81 kg/m.  Musculoskeletal: Strength & Muscle Tone: within normal limits Gait & Station: normal Patient leans: N/A   Recommendations: Patient reviewed with Dr. .  Patient was medically cleared in the emergency department. Follow-up with outpatient psychiatry at [facility] behavioral health center and provided substance use treatment resources.  Based on my evaluation the patient does not appear to have an emergency medical condition.  [facility], FNP 06/16/2020, 1:57 PM
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
## 2020-06-16 NOTE — Progress Notes (Signed)
CSW provided pt with the following substance abuse resources/community support information in Discharge Instructions/AVS:   Substance abuse resources and Residential Options:  [facility] day residential substance abuse facility (not an option if you have active assault charges). [address] [place], [place] [address] Phone: [phone]: Ask for Drema Pry in admissions to complete intake if interested in pursuing this option.  [facility]: Can get intake scheduled; (not an option if you have active assault charges). [address] [place], [place] [address]) Call Mon-Fri.  [facility]): (offers outpatient therapy and intensive outpatient substance abuse therapy).  [address], [place], [place] [address] Phone: [phone]  [facility]: Offers FREE recovery skills classes, support groups, 1:1 Peer Support, and Compeer Classes. [address] [place], [place] [address] Phone: [phone] (Call to complete intake).   [facility] [address] [place], [place] [address] Phone: [phone] ext [phone]  [facility] provides food, shelter and other programs and services to the homeless men of [place] through our [employer] program.  By offering safe shelter, three meals a day, clean clothing, Biblical counseling, financial planning, vocational training, GED/education and employment assistance, we've helped mend the shattered lives of many homeless men since opening in 1974.  We have approximately 267 beds available, with a max of 312 beds including mats for emergency situations and currently house an average of 270 men a night.  Prospective Client Check-In Information Photo ID Required (State/ Out of State/ [facility]) - if photo ID is not available, clients are required to have a printout of a police/sheriff's criminal history report. Help out with chores around the [facility]. No sex  offender of any type (pending, charged, registered and/or any other sex related offenses) will be permitted to check in. Must be willing to abide by all rules, regulations, and policies established by the [employer]. The following will be provided - shelter, food, clothing, and biblical counseling. If you or someone you know is in need of assistance at our [facility] shelter in [place], [place], please call [phone] ext. [phone].  [facility] [place]/RESOURCES [address] [place], [place] [address] PHONE: [phone] Hours: Monday-Friday 8:00AM-3:00PM  This agency offers the following services for free:   *Integrated Care -Case management -[facility] [employer] -Medical clinic -Mental health nurse -Referrals  *Fundamental Services -Showers and hygiene supplies -Laundry -Phone access -Mailing addresses and mailboxes -Replacement IDs -Onsite barbershop -Storage lockers -[employer] winter warming center  *Engineer, drilling -Skilled trade classes -Job skills classes -Resume and jobs application assistance -Interview training -GED classes -Audiological scientist vouchers -Buyer, retail S. Alan Ripper, MSW, LCSW Clinical Social Worker 06/16/2020 2:00 PM

## 2020-10-05 IMAGING — CR DG FOOT COMPLETE 3+V*R*
3 series · 3 of 3 positions shown · non-contrast
Comparison: Concurrent ankle radiograph.

CLINICAL DATA: Fifth metatarsal phalangeal tenderness to palpation.
Patient reports left fibular fracture 1 month ago. Twisted ankle
stepping off curb last night. Swelling.

EXAM:
RIGHT FOOT COMPLETE - 3+ VIEW

[x foot ap right]
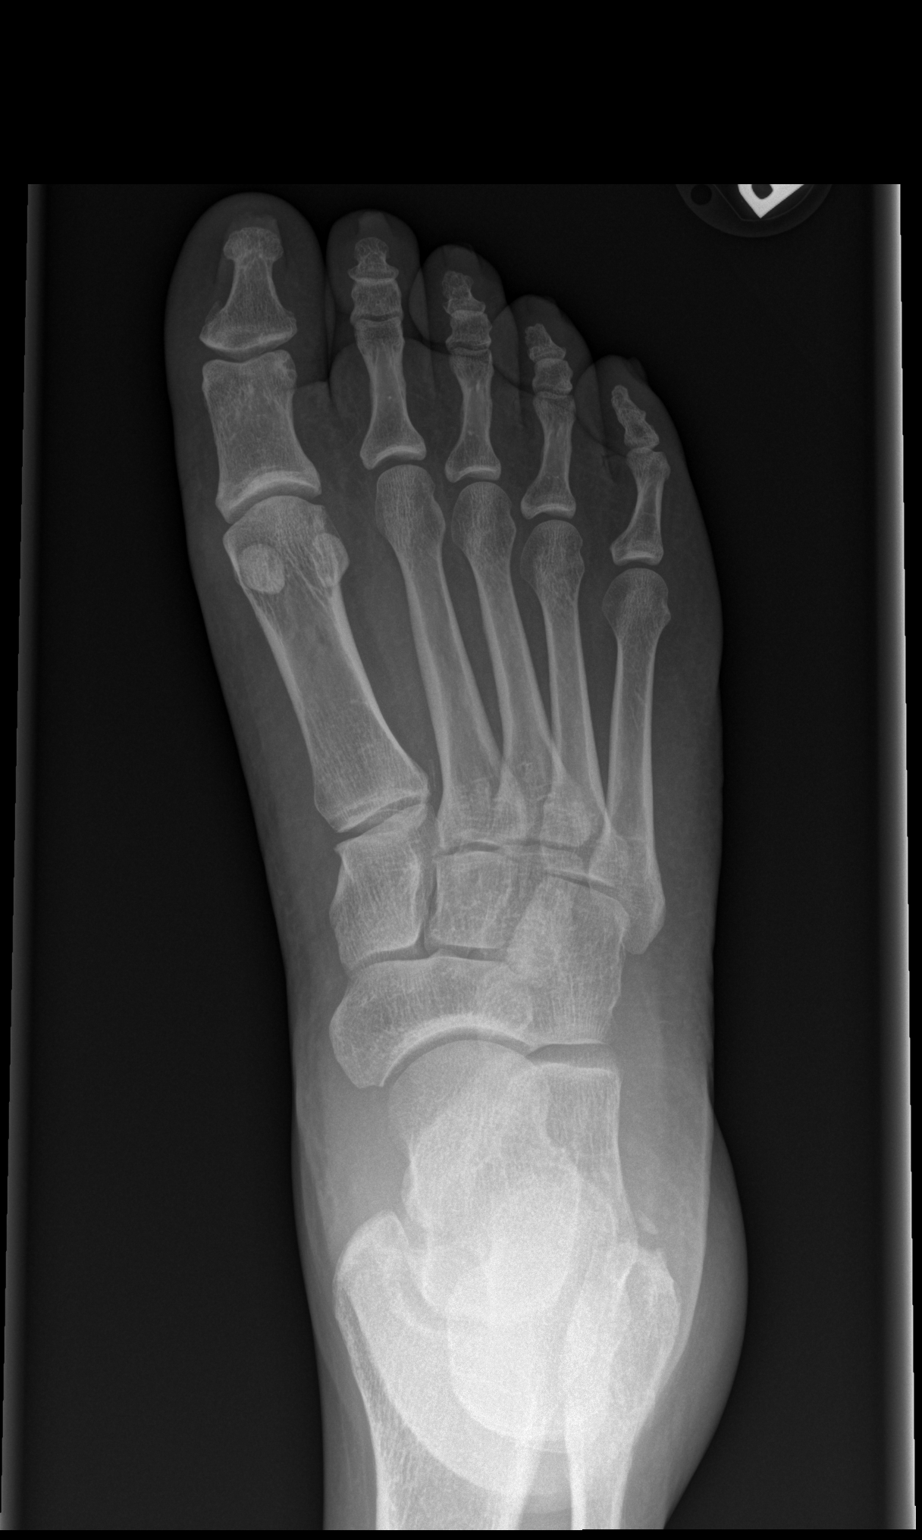

[x foot obl right]
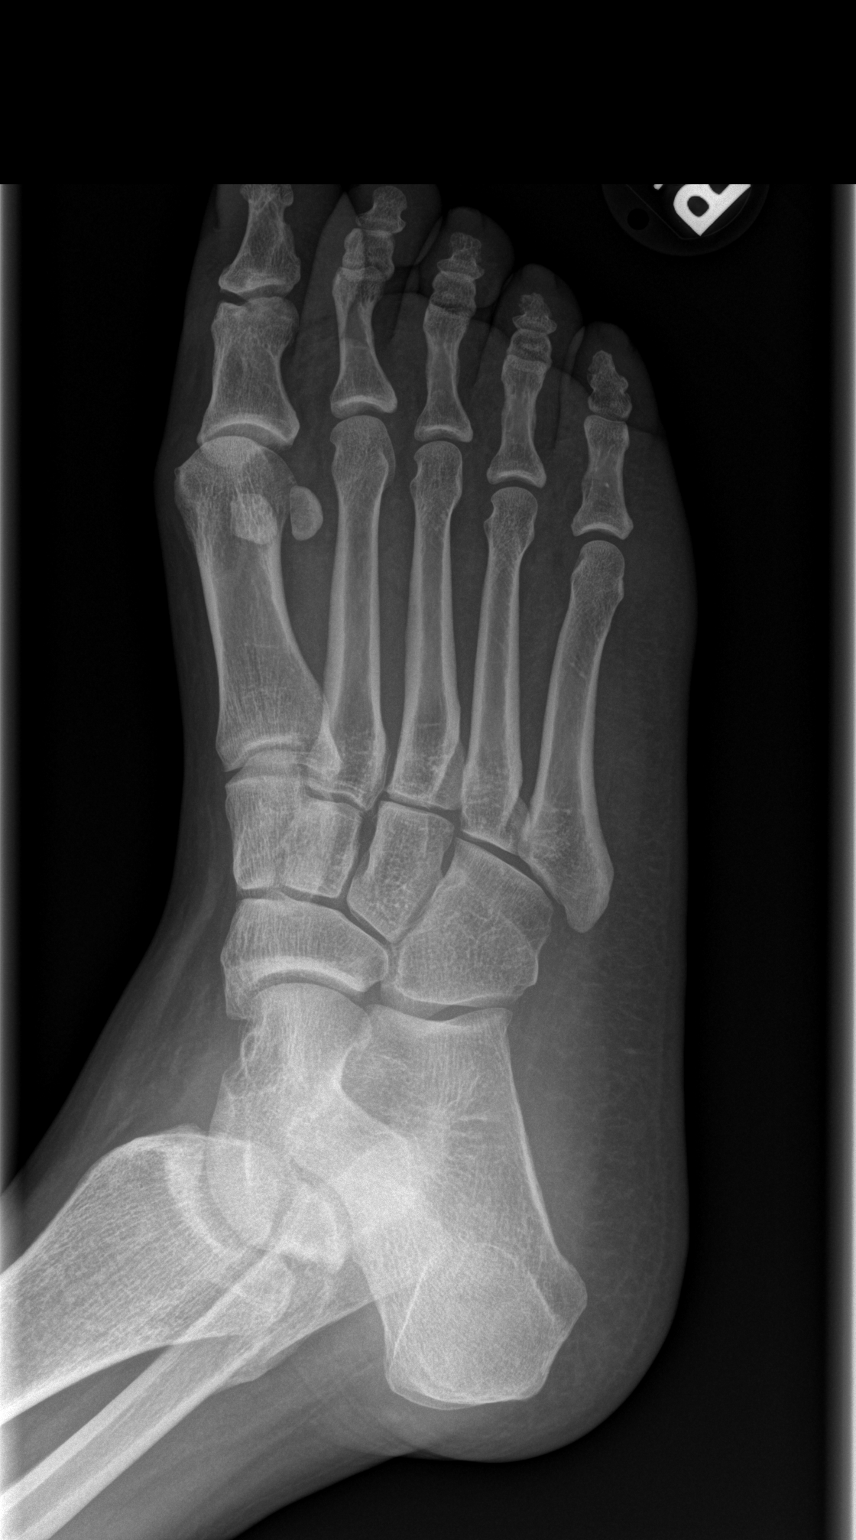

[x foot lat right]
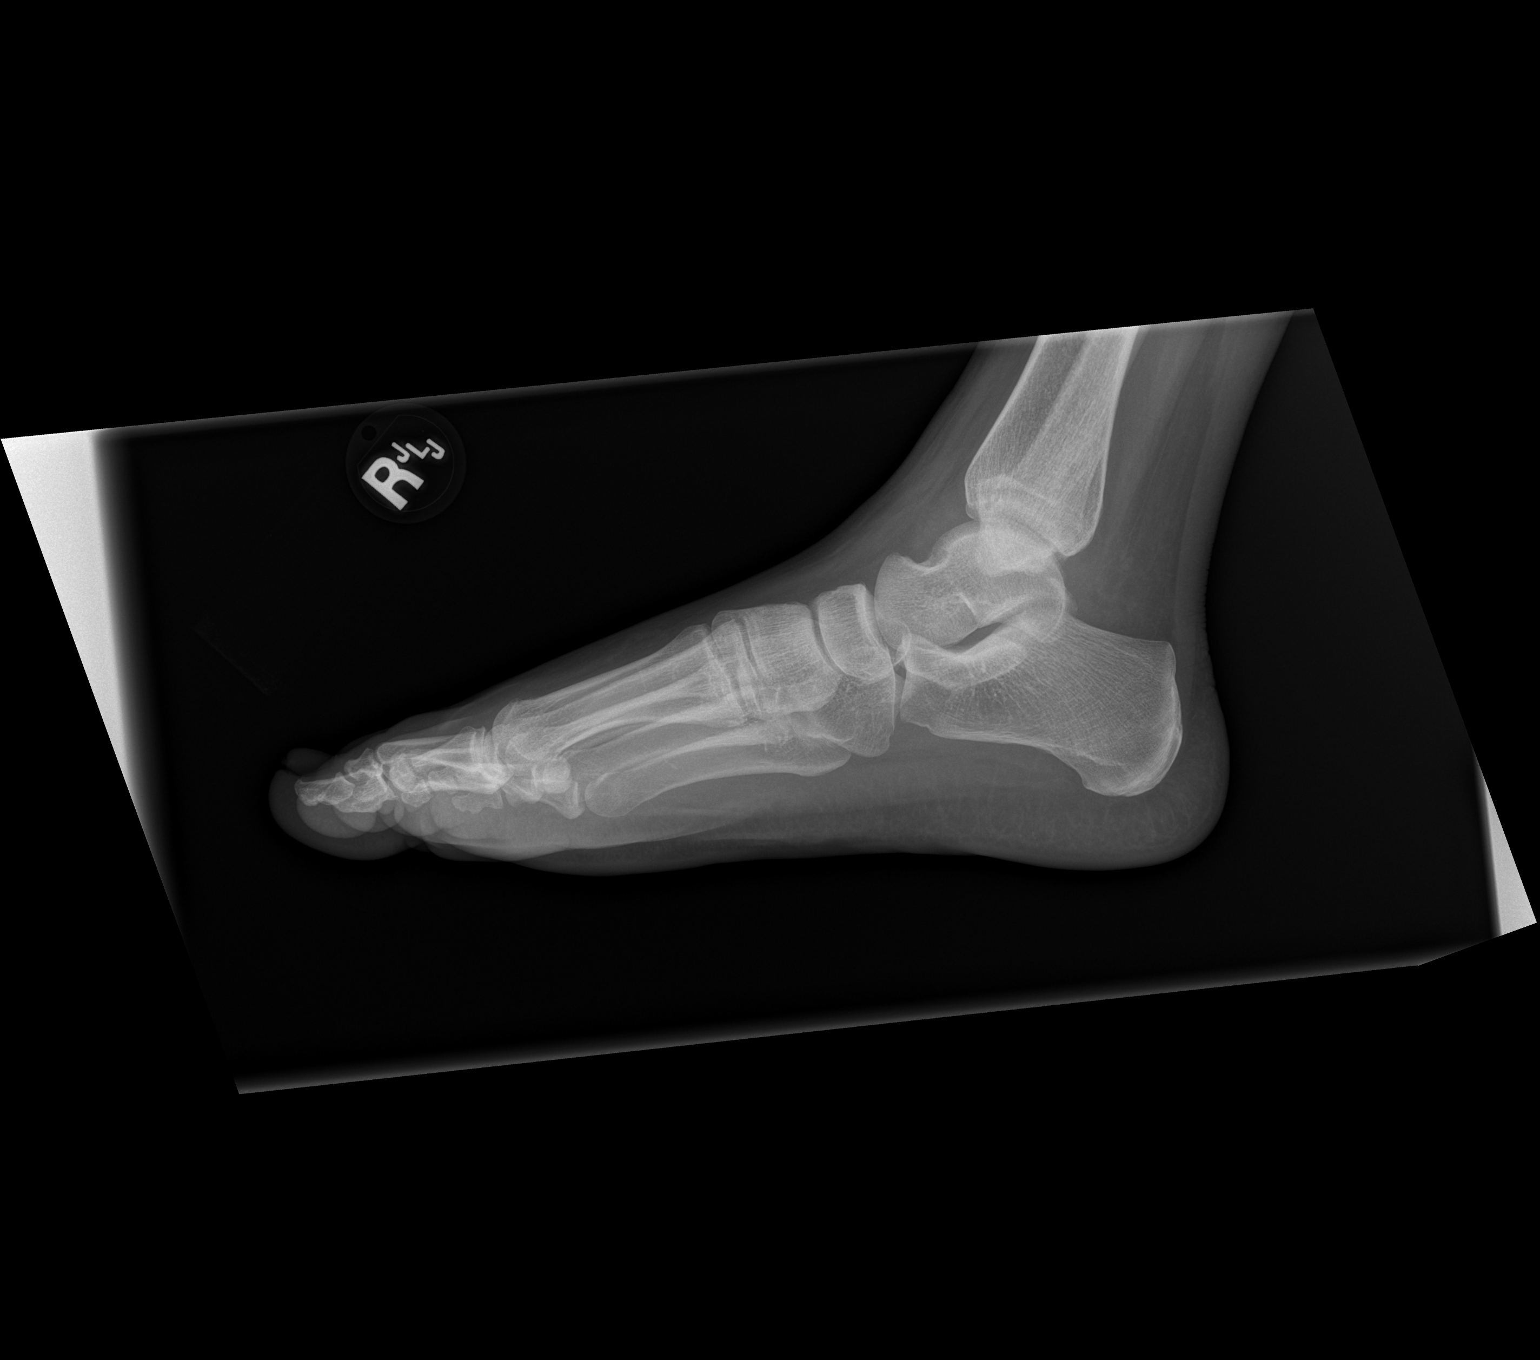

[3 of 3 positions shown; findings below may reference images not displayed]

FINDINGS: There is no evidence of foot fracture or dislocation. Particularly,
the fifth metatarsal is intact. There is no evidence of arthropathy
or other focal bone abnormality. Soft tissues are unremarkable.

Distal fibula better assessed on concurrent ankle exam.
IMPRESSION: No fracture of the right foot. Particularly, no fracture of the
fifth ray.

## 2020-11-04 NOTE — Progress Notes (Deleted)
    SUBJECTIVE:   CHIEF COMPLAINT / HPI:   Yolanda Hendricks is a 50 yo who presents for glucose check, medication refill, and pap smear  Last Hgb a1c 7.5   PERTINENT  PMH / PSH: ***  OBJECTIVE:   There were no vitals taken for this visit.  ***  ASSESSMENT/PLAN:   No problem-specific Assessment & Plan notes found for this encounter.     Yolanda Collum, DO [facility]

## 2020-11-05 ENCOUNTER — Ambulatory Visit: Payer: Self-pay | Admitting: Family Medicine

## 2021-08-22 ENCOUNTER — Other Ambulatory Visit: Payer: Self-pay | Admitting: Family Medicine

## 2021-08-22 MED ORDER — TRAZODONE HCL 50 MG PO TABS: 50.0000 mg | ORAL_TABLET | Freq: Every day | ORAL | 0 refills | Status: DC

## 2021-10-22 ENCOUNTER — Other Ambulatory Visit: Payer: Self-pay

## 2021-10-22 ENCOUNTER — Encounter (HOSPITAL_COMMUNITY): Payer: Self-pay

## 2021-10-22 ENCOUNTER — Emergency Department (HOSPITAL_COMMUNITY)
Admission: EM | Admit: 2021-10-22 | Discharge: 2021-10-22 | Disposition: A | Discharge status: Home / Self Care | Payer: Self-pay | Attending: Emergency Medicine | Admitting: Emergency Medicine

## 2021-10-22 DIAGNOSIS — N9489 Other specified conditions associated with female genital organs and menstrual cycle: Secondary | ICD-10-CM | POA: Insufficient documentation

## 2021-10-22 DIAGNOSIS — Z7984 Long term (current) use of oral hypoglycemic drugs: Secondary | ICD-10-CM | POA: Insufficient documentation

## 2021-10-22 DIAGNOSIS — F17210 Nicotine dependence, cigarettes, uncomplicated: Secondary | ICD-10-CM | POA: Insufficient documentation

## 2021-10-22 DIAGNOSIS — R519 Headache, unspecified: Secondary | ICD-10-CM | POA: Insufficient documentation

## 2021-10-22 DIAGNOSIS — Z79899 Other long term (current) drug therapy: Secondary | ICD-10-CM | POA: Insufficient documentation

## 2021-10-22 DIAGNOSIS — R739 Hyperglycemia, unspecified: Secondary | ICD-10-CM

## 2021-10-22 DIAGNOSIS — E119 Type 2 diabetes mellitus without complications: Secondary | ICD-10-CM | POA: Insufficient documentation

## 2021-10-22 DIAGNOSIS — J45909 Unspecified asthma, uncomplicated: Secondary | ICD-10-CM | POA: Insufficient documentation

## 2021-10-22 LAB — URINALYSIS, ROUTINE W REFLEX MICROSCOPIC
Bacteria, UA: NONE SEEN
Bilirubin Urine: NEGATIVE
Glucose, UA: >=500 mg/dL — AB
Hgb urine dipstick: NEGATIVE
Ketones, ur: 5 mg/dL — AB
Leukocytes,Ua: NEGATIVE
Nitrite: NEGATIVE
Protein, ur: NEGATIVE mg/dL
Specific Gravity, Urine: 1.008 (ref 1.005–1.030)
pH: 7 (ref 5.0–8.0)

## 2021-10-22 LAB — CBC WITH DIFFERENTIAL/PLATELET
Abs Immature Granulocytes: 0.05 10*3/uL (ref 0.00–0.07)
Basophils Absolute: 0.1 10*3/uL (ref 0.0–0.1)
Basophils Relative: 1 %
Eosinophils Absolute: 0.1 10*3/uL (ref 0.0–0.5)
Eosinophils Relative: 0 %
HCT: 43.9 % (ref 36.0–46.0)
Hemoglobin: 15 g/dL (ref 12.0–15.0)
Immature Granulocytes: 0 %
Lymphocytes Relative: 14 %
Lymphs Abs: 1.6 10*3/uL (ref 0.7–4.0)
MCH: 32.9 pg (ref 26.0–34.0)
MCHC: 34.2 g/dL (ref 30.0–36.0)
MCV: 96.3 fL (ref 80.0–100.0)
Monocytes Absolute: 0.5 10*3/uL (ref 0.1–1.0)
Monocytes Relative: 4 %
Neutro Abs: 9.3 10*3/uL — ABNORMAL HIGH (ref 1.7–7.7)
Neutrophils Relative %: 81 %
Platelets: 303 10*3/uL (ref 150–400)
RBC: 4.56 MIL/uL (ref 3.87–5.11)
RDW: 12.2 % (ref 11.5–15.5)
WBC: 11.5 10*3/uL — ABNORMAL HIGH (ref 4.0–10.5)
nRBC: 0 % (ref 0.0–0.2)

## 2021-10-22 LAB — ETHANOL: Alcohol, Ethyl (B): <10 mg/dL (ref <10)

## 2021-10-22 LAB — RAPID URINE DRUG SCREEN, HOSP PERFORMED
Amphetamines: NOT DETECTED
Barbiturates: NOT DETECTED
Benzodiazepines: NOT DETECTED
Cocaine: POSITIVE — AB
Opiates: NOT DETECTED
Tetrahydrocannabinol: POSITIVE — AB

## 2021-10-22 LAB — BASIC METABOLIC PANEL
Anion gap: 7 (ref 5–15)
BUN: <5 mg/dL — ABNORMAL LOW (ref 6–20)
CO2: 27 mmol/L (ref 22–32)
Calcium: 9.4 mg/dL (ref 8.9–10.3)
Chloride: 103 mmol/L (ref 98–111)
Creatinine, Ser: 0.59 mg/dL (ref 0.44–1.00)
GFR, Estimated: >60 mL/min (ref >60)
Glucose, Bld: 265 mg/dL — ABNORMAL HIGH (ref 70–99)
Potassium: 3.6 mmol/L (ref 3.5–5.1)
Sodium: 137 mmol/L (ref 135–145)

## 2021-10-22 LAB — CBG MONITORING, ED: Glucose-Capillary: 276 mg/dL — ABNORMAL HIGH (ref 70–99)

## 2021-10-22 LAB — HCG, QUANTITATIVE, PREGNANCY: hCG, Beta Chain, Quant, S: 3 m[IU]/mL (ref <5)

## 2021-10-22 MED ORDER — LORAZEPAM 2 MG/ML IJ SOLN
1.0000 mg | Freq: Once | INTRAMUSCULAR | Status: AC
  Administered 2021-10-22: 20:00:00 1 mg via INTRAVENOUS
  Filled 2021-10-22: qty 1

## 2021-10-22 MED ORDER — LACTATED RINGERS IV SOLN
INTRAVENOUS | Status: DC
  Administered 2021-10-22: 19:00:00 125 mL/h via INTRAVENOUS

## 2021-10-22 MED ORDER — LACTATED RINGERS IV BOLUS
1000.0000 mL | Freq: Once | INTRAVENOUS | Status: AC
  Administered 2021-10-22: 18:00:00 1000 mL via INTRAVENOUS

## 2021-10-22 MED ORDER — AMMONIA AROMATIC IN INHA
RESPIRATORY_TRACT | Status: AC
  Administered 2021-10-22: 21:00:00 1 via NASAL
  Filled 2021-10-22: qty 10

## 2021-10-22 NOTE — ED Notes (Signed)
Pt in bed, pt has some twitching, pt awake and oriented, pt moving all extremities. Police at bedside. Resps even and unlabored.

## 2021-10-22 NOTE — ED Triage Notes (Signed)
Pt [facility] the ED [facility] EMS [facility] jail for hyperglycemia, headache, near syncope, and light-headedness onset around one hour prior to arrival. Per EMS, pt's BG was >200 en route.

## 2021-10-22 NOTE — ED Provider Notes (Signed)
[facility]-EMERGENCY DEPT Provider Note   CSN: [id number] Arrival date & time: 10/22/21  1720     History Chief Complaint  Patient presents with   Headache    Yolanda Hendricks is a 50 y.o. female.  50 year old female with history of diabetes presents under law enforcement after patient states she had anxiety issues along with possible high blood sugar.  [facility] when this episode happened.  Patient states she normally drinks a sixpack to a case a day of beer and last drink yesterday.  Has not had any emesis.  No abdominal discomfort.  Denies any hallucinations.  Also notes that she has been using cocaine as well 2.  Chest pain or chest pressure.  EMS was called and patient's blood sugar was in the 200s.  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     Family History  Problem Relation Age of Onset   Diabetes Other     Social History   Tobacco Use   Smoking status: Some Days    Packs/day: 0.50    Types: Cigarettes   Smokeless tobacco: Never  Substance Use Topics   Alcohol use: Yes    Comment: occ   Drug use: Not Currently    Home Medications Prior to Admission medications   Medication Sig Start Date End Date Taking? Authorizing Provider  albuterol (VENTOLIN HFA) 108 (90 Base) MCG/ACT inhaler Inhale 1-2 puffs into the lungs every 6 (six) hours as needed for wheezing or shortness of  breath. 01/24/20   Myrene Buddy, MD  FLUoxetine (PROZAC) 20 MG capsule Take 1 capsule (20 mg total) by mouth daily. 05/16/20   Cora Collum, DO  gabapentin (NEURONTIN) 300 MG capsule Take 1 capsule (300 mg total) by mouth 3 (three) times daily. Please make appt before next refill 05/16/20   Cora Collum, DO  metFORMIN (GLUCOPHAGE) 500 MG tablet Take 1 tablet (500 mg total) by mouth 2 (two) times daily with a meal. Need an appointment before next refill. 05/16/20   Cora Collum, DO  traZODone (DESYREL) 50 MG tablet Take 1 tablet (50 mg total) by mouth at bedtime. 08/22/21   Cora Collum, DO    Allergies    Patient has no known allergies.  Review of Systems   Review of Systems  All other systems reviewed and are negative.  Physical Exam Updated Vital Signs BP 129/74 (BP Location: Left Arm)    Pulse 93    Temp 98 F (36.7 C) (Oral)    Ht 1.702 m (5\' 7" )    Wt 68.9 kg    SpO2 99%    BMI 23.79 kg/m   Physical Exam Vitals and nursing note reviewed.  Constitutional:      General: She is not in acute distress.    Appearance: Normal appearance. She is well-developed. She is  not toxic-appearing.  HENT:     Head: Normocephalic and atraumatic.  Eyes:     General: Lids are normal.     Conjunctiva/sclera: Conjunctivae normal.     Pupils: Pupils are equal, round, and reactive to light.  Neck:     Thyroid: No thyroid mass.     Trachea: No tracheal deviation.  Cardiovascular:     Rate and Rhythm: Normal rate and regular rhythm.     Heart sounds: Normal heart sounds. No murmur heard.   No gallop.  Pulmonary:     Effort: Pulmonary effort is normal. No respiratory distress.     Breath sounds: Normal breath sounds. No stridor. No decreased breath sounds, wheezing, rhonchi or rales.  Abdominal:     General: There is no distension.     Palpations: Abdomen is soft.     Tenderness: There is no abdominal tenderness. There is no rebound.  Musculoskeletal:        General: No  tenderness. Normal range of motion.     Cervical back: Normal range of motion and neck supple.  Skin:    General: Skin is warm and dry.     Findings: No abrasion or rash.  Neurological:     General: No focal deficit present.     Mental Status: She is alert and oriented to person, place, and time. Mental status is at baseline.     GCS: GCS eye subscore is 4. GCS verbal subscore is 5. GCS motor subscore is 6.     Cranial Nerves: No cranial nerve deficit.     Sensory: No sensory deficit.     Motor: Motor function is intact.  Psychiatric:        Attention and Perception: Attention normal.        Mood and Affect: Affect is flat.        Speech: Speech is delayed.        Behavior: Behavior is withdrawn.    ED Results / Procedures / Treatments   Labs (all labs ordered are listed, but only abnormal results are displayed) Labs Reviewed  CBG MONITORING, ED - Abnormal; Notable for the following components:      Result Value   Glucose-Capillary 276 (*)    All other components within normal limits  CBC WITH DIFFERENTIAL/PLATELET  BASIC METABOLIC PANEL  ETHANOL  RAPID URINE DRUG SCREEN, HOSP PERFORMED  URINALYSIS, ROUTINE W REFLEX MICROSCOPIC  I-STAT BETA HCG BLOOD, ED ([facility], [facility], [facility] ONLY)    EKG EKG Interpretation  Date/Time:  Wednesday October 22 2021 18:11:32 EST Ventricular Rate:  95 PR Interval:  146 QRS Duration: 97 QT Interval:  364 QTC Calculation: 458 R Axis:   65 Text Interpretation: Sinus rhythm Biatrial enlargement RSR' in V1 or V2, right VCD or RVH Confirmed by Lacretia Leigh ([id number]) on 10/22/2021 6:17:07 PM  Radiology No results found.  Procedures Procedures   Medications Ordered in ED Medications  lactated ringers infusion (has no administration in time range)  lactated ringers bolus 1,000 mL (has no administration in time range)    ED Course  I have reviewed the triage vital signs and the nursing notes.  Pertinent labs & imaging results that were available  during my care of the patient were reviewed by me and considered in my medical decision making (see chart for details).    MDM Rules/Calculators/A&P  Patient given Ativan here and was allowed to rest.  She has been monitored and is now awake and alert.  No evidence of withdrawal at this time.  CIWA score noted and was likely from anxiety as opposed to withdrawal.  Blood sugar was 276 and treated with IV fluids.  Will be discharged back to law enforcement custody    Final Clinical Impression(s) / ED Diagnoses Final diagnoses:  None    Rx / DC Orders ED Discharge Orders     None        Lacretia Leigh, MD 10/22/21 2100

## 2021-10-31 ENCOUNTER — Other Ambulatory Visit: Payer: Self-pay

## 2021-10-31 ENCOUNTER — Ambulatory Visit (HOSPITAL_COMMUNITY)
Admission: AD | Admit: 2021-10-31 | Discharge: 2021-10-31 | Disposition: A | Discharge status: Home / Self Care | Payer: No Payment, Other | Attending: Family | Admitting: Family

## 2021-10-31 DIAGNOSIS — F1010 Alcohol abuse, uncomplicated: Secondary | ICD-10-CM | POA: Insufficient documentation

## 2021-10-31 DIAGNOSIS — F332 Major depressive disorder, recurrent severe without psychotic features: Secondary | ICD-10-CM | POA: Insufficient documentation

## 2021-10-31 DIAGNOSIS — Z5900 Homelessness unspecified: Secondary | ICD-10-CM | POA: Insufficient documentation

## 2021-10-31 DIAGNOSIS — Z79899 Other long term (current) drug therapy: Secondary | ICD-10-CM | POA: Insufficient documentation

## 2021-10-31 MED ORDER — FLUOXETINE HCL 20 MG PO CAPS
20.0000 mg | ORAL_CAPSULE | Freq: Every day | ORAL | 0 refills | Status: DC
  Filled 2021-10-31 – 2021-11-04 (×2): qty 30, 30d supply, fill #0

## 2021-10-31 MED ORDER — TRAZODONE HCL 50 MG PO TABS
50.0000 mg | ORAL_TABLET | Freq: Every evening | ORAL | 0 refills | Status: DC | PRN
  Filled 2021-10-31 – 2021-11-04 (×2): qty 30, 30d supply, fill #0

## 2021-10-31 MED ORDER — GABAPENTIN 300 MG PO CAPS
300.0000 mg | ORAL_CAPSULE | Freq: Three times a day (TID) | ORAL | 0 refills | Status: DC
Start: 2021-10-31 — End: 2023-06-28
  Filled 2021-10-31 – 2021-11-04 (×2): qty 90, 30d supply, fill #0

## 2021-10-31 NOTE — Discharge Instructions (Addendum)
[facility]- Outpatient 2nd Floor  New Patient Therapy Walk-ins: Monday-Wednesday: 8am until slots are full.  Please arrive by 7:30 am, patients will be seen in the order of arrival.  Friday: 1pm-5pm Please arrive by 12-12:30pm   New Patient Medication Management Walk-ins: Monday-Friday: 8am-11am.  Please arrive by 7:30am, patients will be seen in the order of arrival.   Please Note: Availability is limited, you may not be seen on the same day that you walk-in. Our goal is to serve and meet the needs of our community to the best of our ability.             Patient is instructed prior to discharge to:  Take all medications as prescribed by his/her mental healthcare provider. Report any adverse effects and or reactions from the medicines to his/her outpatient provider promptly. Keep all scheduled appointments, to ensure that you are getting refills on time and to avoid any interruption in your medication.  If you are unable to keep an appointment call to reschedule.  Be sure to follow-up with resources and follow-up appointments provided.  Patient has been instructed & cautioned: To not engage in alcohol and or illegal drug use while on prescription medicines. In the event of worsening symptoms, patient is instructed to call the crisis hotline, 911 and or go to the nearest ED for appropriate evaluation and treatment of symptoms. To follow-up with his/her primary care provider for your other medical issues, concerns and or health care needs.    Homeless Shelter List:     [facility])  [address] [place], [place]  Phone: [phone]     [facility] [address] (Women only)  [address] [place], [place] [address]  Phone: [phone]     [facility]  [address], [place] [address]  Phone: [phone]     [facility]:  [phone]. [address]  [place], [place] [address]  Phone: [phone]     [facility]  [address] [place], [place] [address]  (Check in at 6:00PM for placement at a local shelter)  Phone: [phone]

## 2021-10-31 NOTE — ED Provider Notes (Signed)
Behavioral Health Urgent Care Medical Screening Exam  Patient Name: Yolanda Hendricks MRN: [id number] Date of Evaluation: 10/31/21 Chief Complaint:   Diagnosis:  Final diagnoses:  MDD (major depressive disorder), recurrent severe, without psychosis (HCC)  Alcohol abuse    History of Present illness: Yolanda Hendricks is a 50 y.o. female. Patient presents voluntarily to [facility] behavioral health for walk-in assessment.  Patient is accompanied by her children's father.  She is no longer in a relationship with her children's father however he does help with transportation and emotional support.  Patient states "I went to the [facility] today, I saw on television the partners ending homelessness program."  Patient notified she would need referral to be enrolled into the partners ending homelessness program.  Patient reports recent stressors include homelessness and inability to keep a job.  Additionally she uses alcohol, typically around 2-40 ounce beers per day.  She reports she uses alcohol because it "makes me feel better."  Last alcohol use on yesterday.  [name] has been diagnosed with major depressive disorder.  She reports most recently medications were effective including Prozac 20 mg daily, gabapentin 300 mg 3 times daily and trazodone 50 mg nightly as needed.  She has not had access to these medications for several months.  She is unable to afford medications at this time related to financial concerns.  She is not currently linked with outpatient psychiatry but plans to follow-up with outpatient psychiatry moving forward.  No history of inpatient psychiatric hospitalizations per patient report.  She is insightful regarding treatment and diagnosis.  She states "I need to be on my medications so that I can get a job again[employer] options including [place] and wellness pharmacy reviewed with patient.  Patient reports she has not been able to fill her blood pressure medication nor  her metformin.  She plans to follow-up with primary care provider as soon as possible.  Patient is assessed face-to-face by nurse practitioner.  She is seated in assessment area, no acute distress.  She is alert and oriented, pleasant and cooperative during assessment.  She presents with depressed mood, tearful affect.  She denies suicidal and homicidal ideations.  She denies history of suicide attempts, denies history of nonsuicidal self-harm behavior.  She contracts verbally for safety with this Clinical research associate.  She has normal speech and behavior.  She denies both auditory and visual hallucinations.  Patient is able to converse coherently with goal-directed thoughts and no distractibility or preoccupation.  She denies paranoia.  Objectively there is no evidence of psychosis/mania or delusional thinking.  Yolanda Hendricks currently is resides with a friend in [place].  She has been homeless and sleeping over at the homes of friends since 2019.  She denies access to weapons.  She is not currently employed but is typically employed in [employer].  She denies substance use aside from alcohol.  Patient endorses decreased sleep and appetite.  Patient offered support and encouragement.   Psychiatric Specialty Exam  Presentation  General Appearance:Appropriate for Environment; Casual  Eye Contact:Good  Speech:Clear and Coherent; Normal Rate  Speech Volume:Normal  Handedness:Right   Mood and Affect  Mood:Depressed  Affect:Depressed; Tearful   Thought Process  Thought Processes:Coherent; Goal Directed; Linear  Descriptions of Associations:Intact  Orientation:Full (Time, Place and Person)  Thought Content:Logical; WDL    Hallucinations:None  Ideas of Reference:None  Suicidal Thoughts:No  Homicidal Thoughts:No   Sensorium  Memory:Immediate Good; Recent Good; Remote Good  Judgment:Fair  Insight:Fair   Executive Functions  Concentration:Good  Attention  Span:Good  Recall:Good  Fund of Knowledge:Good  Language:Good   Psychomotor Activity  Psychomotor Activity:Normal   Assets  Assets:Communication Skills; Desire for Improvement; Leisure Time; Physical Health; Resilience; Social Support   Sleep  Sleep:Fair  Number of hours: No data recorded  No data recorded  Physical Exam: Physical Exam Vitals and nursing note reviewed.  Constitutional:      Appearance: Normal appearance. She is well-developed and normal weight.  HENT:     Head: Normocephalic and atraumatic.     Nose: Nose normal.  Cardiovascular:     Rate and Rhythm: Normal rate.  Pulmonary:     Effort: Pulmonary effort is normal.  Musculoskeletal:        General: Normal range of motion.     Cervical back: Normal range of motion.  Skin:    General: Skin is warm and dry.  Neurological:     Mental Status: She is alert and oriented to person, place, and time.  Psychiatric:        Attention and Perception: Attention and perception normal.        Mood and Affect: Mood is depressed. Affect is tearful.        Speech: Speech normal.        Behavior: Behavior normal. Behavior is cooperative.        Thought Content: Thought content normal.        Cognition and Memory: Cognition and memory normal.        Judgment: Judgment normal.   Review of Systems  Constitutional: Negative.   HENT: Negative.    Eyes: Negative.   Respiratory: Negative.    Cardiovascular: Negative.   Gastrointestinal: Negative.   Genitourinary: Negative.   Musculoskeletal: Negative.   Skin: Negative.   Neurological: Negative.   Endo/Heme/Allergies: Negative.   Psychiatric/Behavioral:  Positive for depression and substance abuse.   Blood pressure 129/88, pulse (!) 102, temperature 98 F (36.7 C), temperature source Oral, resp. rate 18, SpO2 98 %. There is no height or weight on file to calculate BMI.  Musculoskeletal: Strength & Muscle Tone: within normal limits Gait & Station:  normal Patient leans: N/A   [facility] Discharge Disposition for Follow up and Recommendations: Based on my evaluation the patient does not appear to have an emergency medical condition and can be discharged with resources and follow up care in outpatient services for Medication Management and Individual Therapy Patient reviewed with Dr. Lovette Cliche. Follow-up with primary care provider at [facility] family medical. Follow-up with outpatient psychiatry, resources provided. Current medications: -Fluoxetine 20 mg daily/mood -Gabapentin 300 mg 3 times daily/mood -Trazodone 50 mg nightly as needed/sleep  Follow-up with housing resources provided.  Disposition social worker made partners ending homelessness referral per patient request.   Lucky Rathke, FNP 10/31/2021, 1:02 PM

## 2021-10-31 NOTE — Progress Notes (Signed)
CSW left a message for [facility] end homelessness coordinated entry hotline to request to reach out to patient and provide her with additional resources for housing.    Omari Koslosky, LCSW, LCAS Clincal Social Worker  [facility]

## 2021-10-31 NOTE — BH Assessment (Signed)
Pt states "I'm homeless and need somewhere to stay". Pt reports she is a diabetic and has not had her medications. Pt reports having issues with vision currently. Pt reports she has been off psychotropic medications for months. Pt reports daily SI but denies suicidal ideation at this time. Pt reports ETOH use of 1-2 40oz beers a day but reports she has not had anything to drink today. Pt denies history of seizures but currently experiencing slight hand tremors. Pt denies HI and AVH and her hopes are to get resources for biopsychosocial stressors or at least directed to the right place.   Pt is routine.

## 2021-11-04 ENCOUNTER — Other Ambulatory Visit: Payer: Self-pay

## 2021-11-12 ENCOUNTER — Telehealth (HOSPITAL_COMMUNITY): Payer: Self-pay | Admitting: Family Medicine

## 2021-11-12 NOTE — BH Assessment (Signed)
Care Management - [facility] Follow Up Discharges   Writer attempted to make contact with patient today and was unsuccessful.  Phone is disconnected.   Per chart review, patient was provided with outpatient resources.

## 2021-12-09 ENCOUNTER — Ambulatory Visit: Payer: Self-pay | Admitting: Family Medicine

## 2021-12-16 ENCOUNTER — Ambulatory Visit: Payer: Self-pay | Admitting: Family Medicine

## 2021-12-16 NOTE — Progress Notes (Deleted)
° ° °  SUBJECTIVE:   CHIEF COMPLAINT / HPI:   Yolanda Hendricks is a 51 yo who presents for diabetes follow up and to discuss medication   PERTINENT  PMH / PSH: *** MDD, T2DM, Cocaine use, THC use,  OBJECTIVE:   There were no vitals taken for this visit.  ***  ASSESSMENT/PLAN:   No problem-specific Assessment & Plan notes found for this encounter.     Cora Collum, DO [facility]

## 2022-03-24 ENCOUNTER — Other Ambulatory Visit: Payer: Self-pay | Admitting: Family Medicine

## 2022-03-24 ENCOUNTER — Other Ambulatory Visit: Payer: Self-pay | Admitting: Endocrinology

## 2022-03-24 DIAGNOSIS — Z1231 Encounter for screening mammogram for malignant neoplasm of breast: Secondary | ICD-10-CM

## 2022-03-27 DIAGNOSIS — Z1231 Encounter for screening mammogram for malignant neoplasm of breast: Secondary | ICD-10-CM

## 2022-04-07 ENCOUNTER — Encounter: Payer: Self-pay | Admitting: *Deleted

## 2022-04-30 ENCOUNTER — Ambulatory Visit: Payer: Self-pay | Admitting: Family Medicine

## 2022-05-07 ENCOUNTER — Ambulatory Visit: Payer: Self-pay | Admitting: Family Medicine

## 2022-05-18 ENCOUNTER — Ambulatory Visit: Payer: Self-pay | Admitting: Family Medicine

## 2022-06-29 ENCOUNTER — Ambulatory Visit: Payer: Self-pay | Admitting: Family Medicine

## 2023-06-23 ENCOUNTER — Emergency Department (HOSPITAL_COMMUNITY): Payer: Medicaid Other

## 2023-06-23 ENCOUNTER — Inpatient Hospital Stay (HOSPITAL_COMMUNITY)
Admission: EM | Admit: 2023-06-23 | Discharge: 2023-06-28 | DRG: 603 | Disposition: A | Discharge status: Home / Self Care | Payer: Medicaid Other | Attending: Family Medicine | Admitting: Family Medicine

## 2023-06-23 ENCOUNTER — Encounter (HOSPITAL_COMMUNITY): Payer: Self-pay

## 2023-06-23 ENCOUNTER — Other Ambulatory Visit: Payer: Self-pay

## 2023-06-23 DIAGNOSIS — F329 Major depressive disorder, single episode, unspecified: Secondary | ICD-10-CM | POA: Diagnosis present

## 2023-06-23 DIAGNOSIS — Z5901 Sheltered homelessness: Secondary | ICD-10-CM

## 2023-06-23 DIAGNOSIS — E1165 Type 2 diabetes mellitus with hyperglycemia: Secondary | ICD-10-CM

## 2023-06-23 DIAGNOSIS — Z833 Family history of diabetes mellitus: Secondary | ICD-10-CM

## 2023-06-23 DIAGNOSIS — Z7984 Long term (current) use of oral hypoglycemic drugs: Secondary | ICD-10-CM

## 2023-06-23 DIAGNOSIS — R109 Unspecified abdominal pain: Secondary | ICD-10-CM | POA: Diagnosis present

## 2023-06-23 DIAGNOSIS — I10 Essential (primary) hypertension: Secondary | ICD-10-CM | POA: Diagnosis present

## 2023-06-23 DIAGNOSIS — R1011 Right upper quadrant pain: Secondary | ICD-10-CM | POA: Diagnosis present

## 2023-06-23 DIAGNOSIS — T63301A Toxic effect of unspecified spider venom, accidental (unintentional), initial encounter: Secondary | ICD-10-CM | POA: Diagnosis present

## 2023-06-23 DIAGNOSIS — Z23 Encounter for immunization: Secondary | ICD-10-CM

## 2023-06-23 DIAGNOSIS — E119 Type 2 diabetes mellitus without complications: Secondary | ICD-10-CM

## 2023-06-23 DIAGNOSIS — Z22322 Carrier or suspected carrier of Methicillin resistant Staphylococcus aureus: Secondary | ICD-10-CM

## 2023-06-23 DIAGNOSIS — L0390 Cellulitis, unspecified: Secondary | ICD-10-CM | POA: Diagnosis present

## 2023-06-23 DIAGNOSIS — L03312 Cellulitis of back [any part except buttock]: Secondary | ICD-10-CM | POA: Diagnosis present

## 2023-06-23 DIAGNOSIS — Z79899 Other long term (current) drug therapy: Secondary | ICD-10-CM

## 2023-06-23 DIAGNOSIS — J449 Chronic obstructive pulmonary disease, unspecified: Secondary | ICD-10-CM | POA: Diagnosis present

## 2023-06-23 DIAGNOSIS — J45909 Unspecified asthma, uncomplicated: Secondary | ICD-10-CM | POA: Diagnosis present

## 2023-06-23 DIAGNOSIS — L02212 Cutaneous abscess of back [any part, except buttock]: Principal | ICD-10-CM | POA: Diagnosis present

## 2023-06-23 DIAGNOSIS — F17210 Nicotine dependence, cigarettes, uncomplicated: Secondary | ICD-10-CM | POA: Diagnosis present

## 2023-06-23 HISTORY — DX: Major depressive disorder, single episode, unspecified: F32.9

## 2023-06-23 LAB — COMPREHENSIVE METABOLIC PANEL
ALT: 12 U/L (ref 0–44)
AST: 17 U/L (ref 15–41)
Albumin: 3.4 g/dL — ABNORMAL LOW (ref 3.5–5.0)
Alkaline Phosphatase: 143 U/L — ABNORMAL HIGH (ref 38–126)
Anion gap: 19 — ABNORMAL HIGH (ref 5–15)
BUN: 6 mg/dL (ref 6–20)
CO2: 24 mmol/L (ref 22–32)
Calcium: 9.6 mg/dL (ref 8.9–10.3)
Chloride: 93 mmol/L — ABNORMAL LOW (ref 98–111)
Creatinine, Ser: 0.62 mg/dL (ref 0.44–1.00)
GFR, Estimated: >60 mL/min (ref >60)
Glucose, Bld: 249 mg/dL — ABNORMAL HIGH (ref 70–99)
Potassium: 3.6 mmol/L (ref 3.5–5.1)
Sodium: 136 mmol/L (ref 135–145)
Total Bilirubin: 0.7 mg/dL (ref 0.3–1.2)
Total Protein: 8.4 g/dL — ABNORMAL HIGH (ref 6.5–8.1)

## 2023-06-23 LAB — CBC WITH DIFFERENTIAL/PLATELET
Abs Immature Granulocytes: 0.1 10*3/uL — ABNORMAL HIGH (ref 0.00–0.07)
Basophils Absolute: 0.1 10*3/uL (ref 0.0–0.1)
Basophils Relative: 1 %
Eosinophils Absolute: 0.1 10*3/uL (ref 0.0–0.5)
Eosinophils Relative: 1 %
HCT: 47.5 % — ABNORMAL HIGH (ref 36.0–46.0)
Hemoglobin: 15.9 g/dL — ABNORMAL HIGH (ref 12.0–15.0)
Immature Granulocytes: 1 %
Lymphocytes Relative: 5 %
Lymphs Abs: 0.9 10*3/uL (ref 0.7–4.0)
MCH: 30.8 pg (ref 26.0–34.0)
MCHC: 33.5 g/dL (ref 30.0–36.0)
MCV: 91.9 fL (ref 80.0–100.0)
Monocytes Absolute: 1 10*3/uL (ref 0.1–1.0)
Monocytes Relative: 6 %
Neutro Abs: 14 10*3/uL — ABNORMAL HIGH (ref 1.7–7.7)
Neutrophils Relative %: 86 %
Platelets: 360 10*3/uL (ref 150–400)
RBC: 5.17 MIL/uL — ABNORMAL HIGH (ref 3.87–5.11)
RDW: 12.3 % (ref 11.5–15.5)
WBC: 16.2 10*3/uL — ABNORMAL HIGH (ref 4.0–10.5)
nRBC: 0 % (ref 0.0–0.2)

## 2023-06-23 LAB — I-STAT CG4 LACTIC ACID, ED: Lactic Acid, Venous: 1 mmol/L (ref 0.5–1.9)

## 2023-06-23 MED ORDER — KETOROLAC TROMETHAMINE 30 MG/ML IJ SOLN
15.0000 mg | Freq: Once | INTRAMUSCULAR | Status: AC
  Administered 2023-06-23: 15 mg via INTRAVENOUS
  Filled 2023-06-23: qty 1

## 2023-06-23 MED ORDER — VANCOMYCIN HCL IN DEXTROSE 1-5 GM/200ML-% IV SOLN
1000.0000 mg | Freq: Once | INTRAVENOUS | Status: AC
  Administered 2023-06-23: 1000 mg via INTRAVENOUS
  Filled 2023-06-23: qty 200

## 2023-06-23 MED ORDER — SODIUM CHLORIDE 0.9 % IV BOLUS
1000.0000 mL | Freq: Once | INTRAVENOUS | Status: AC
  Administered 2023-06-23: 1000 mL via INTRAVENOUS

## 2023-06-23 MED ORDER — FENTANYL CITRATE PF 50 MCG/ML IJ SOSY
50.0000 ug | PREFILLED_SYRINGE | Freq: Once | INTRAMUSCULAR | Status: AC
  Administered 2023-06-23: 50 ug via INTRAVENOUS
  Filled 2023-06-23: qty 1

## 2023-06-23 NOTE — ED Triage Notes (Addendum)
PER EMS: pt presents with large wound to the right side of her back with purulent drainage that has progressively enlarged over the past 5 days. She thinks it could be a spider bite but she did not see a spider. She repots nausea and feeling sick, states she has not eaten in 2 days.   BP- 134/92, HR-102, 99% RA, CBG-253 20g RAC

## 2023-06-23 NOTE — ED Notes (Signed)
Patient transported to CT 

## 2023-06-23 NOTE — ED Notes (Signed)
Collected I Stat Lactic Acid and walked to mini lab, lab tech notified.

## 2023-06-23 NOTE — ED Provider Notes (Signed)
[place] EMERGENCY DEPARTMENT AT [facility] Provider Note   CSN: [id number] Arrival date & time: 06/23/23  2038     History  Chief Complaint  Patient presents with   Wound Infection    Yolanda Hendricks is a 52 y.o. female.  HPI Patient presents with few day history of pain and swelling in her right back.  Has been more swollen and painful.  Has had some chills.  States a friend of hers tried to cut it open with a razor blade but no real drainage.  Has not been eating.  Has been feeling bad.  History of diabetes and states sugars have been running high.   Past Medical History:  Diagnosis Date   Asthma    Diabetes mellitus without complication (HCC)     Home Medications Prior to Admission medications   Medication Sig Start Date End Date Taking? Authorizing Provider  acetaminophen (TYLENOL) 325 MG tablet Take 650 mg by mouth every 6 (six) hours as needed for mild pain.   Yes [provider]  albuterol (VENTOLIN HFA) 108 (90 Base) MCG/ACT inhaler Inhale 1-2 puffs into the lungs every 6 (six) hours as needed for wheezing or shortness of breath. 01/24/20  Yes Myrene Buddy, MD  FLUoxetine (PROZAC) 20 MG capsule Take 1 capsule (20 mg total) by mouth daily. 10/31/21  Yes Lenard Lance, FNP  gabapentin (NEURONTIN) 300 MG capsule Take 1 capsule (300 mg total) by mouth 3 (three) times daily. Please make appt before next refill 10/31/21  Yes Lenard Lance, FNP  metFORMIN (GLUCOPHAGE) 500 MG tablet Take 1 tablet (500 mg total) by mouth 2 (two) times daily with a meal. Need an appointment before next refill. 05/16/20  Yes Paige, Lucas Mallow, DO      Allergies    Patient has no known allergies.    Review of Systems   Review of Systems  Physical Exam Updated Vital Signs BP 105/75   Pulse 96   Temp 98.3 F (36.8 C) (Oral)   Resp 16   Ht 5\' 7"  (1.702 m)   Wt 59 kg   LMP 06/23/2019 (Approximate)   SpO2 100%   BMI 20.36 kg/m  Physical Exam Vitals and nursing  note reviewed.  HENT:     Head: Normocephalic.  Cardiovascular:     Rate and Rhythm: Tachycardia present.  Chest:     Chest wall: No tenderness.  Abdominal:     Tenderness: There is no abdominal tenderness.  Skin:    Comments: On the right flank posteriorly there is a large swollen tender erythematous area.  Approximately 12 cm across.  There was a wound on the more superior aspect but no drainage.  Neurological:     Mental Status: She is alert.     ED Results / Procedures / Treatments   Labs (all labs ordered are listed, but only abnormal results are displayed) Labs Reviewed  COMPREHENSIVE METABOLIC PANEL - Abnormal; Notable for the following components:      Result Value   Chloride 93 (*)    Glucose, Bld 249 (*)    Total Protein 8.4 (*)    Albumin 3.4 (*)    Alkaline Phosphatase 143 (*)    Anion gap 19 (*)    All other components within normal limits  CBC WITH DIFFERENTIAL/PLATELET - Abnormal; Notable for the following components:   WBC 16.2 (*)    RBC 5.17 (*)    Hemoglobin 15.9 (*)    HCT 47.5 (*)  Neutro Abs 14.0 (*)    Abs Immature Granulocytes 0.10 (*)    All other components within normal limits  CULTURE, BLOOD (ROUTINE X 2)  CULTURE, BLOOD (ROUTINE X 2)  I-STAT CG4 LACTIC ACID, ED  I-STAT CG4 LACTIC ACID, ED    EKG None  Radiology No results found.  Procedures Procedures    Medications Ordered in ED Medications  vancomycin (VANCOCIN) IVPB 1000 mg/200 mL premix (1,000 mg Intravenous New Bag/Given 06/23/23 2315)  sodium chloride 0.9 % bolus 1,000 mL (has no administration in time range)  fentaNYL (SUBLIMAZE) injection 50 mcg (50 mcg Intravenous Given 06/23/23 2257)  ketorolac (TORADOL) 30 MG/ML injection 15 mg (15 mg Intravenous Given 06/23/23 2257)    ED Course/ Medical Decision Making/ A&P                                 Medical Decision Making Amount and/or Complexity of Data Reviewed Labs: ordered. Radiology: ordered.  Risk Prescription  drug management. Decision regarding hospitalization.   Patient with wound on back.  Red and swollen.  Likely cellulitis versus abscess.  I had done a quick bedside ultrasound and showed some cobblestoning which indicates cellulitis and potentially a smaller abscess superiorly but not 1 easily reachable.  White count elevated at 16.  Does not appear overtly septic at this time but did have initial tachycardia but has normal lactic acid.  Is diabetic and sugar above 200.  Antibiotics given.  However I think with the size and pain at the site would benefit from CT scan for further evaluation to look for drainable collection although I think may be phlegmon at this time.  However I think she benefit from admission to the hospital.  Will discuss with family practice, who is her PCP.         Final Clinical Impression(s) / ED Diagnoses Final diagnoses:  Cellulitis of back except buttock  Type 2 diabetes mellitus without complication, unspecified whether long term insulin use [facility])    Rx / DC Orders ED Discharge Orders     None         Benjiman Core, MD 06/23/23 2325

## 2023-06-23 NOTE — ED Provider Triage Note (Signed)
Emergency Medicine Provider Triage Evaluation Note  Yolanda Hendricks , a 52 y.o. female  was evaluated in triage.  Pt complains of wound on right back..  Review of Systems    Physical Exam  BP 114/77 (BP Location: Right Arm)   Pulse (!) 108   Temp 98.3 F (36.8 C) (Oral)   Resp 17   Ht 5\' 7"  (1.702 m)   Wt 59 kg   LMP 06/23/2019 (Approximate)   SpO2 100%   BMI 20.36 kg/m   Large firm mass on right posterior flank.  Erythema.  Some tachycardia.  Afebrile.  Likely abscess.  Will get basic blood work.  Will need closer examination to evaluate for potential drainage versus further imaging.   Benjiman Core, MD 06/23/23 2107

## 2023-06-24 ENCOUNTER — Observation Stay (HOSPITAL_COMMUNITY): Payer: Medicaid Other

## 2023-06-24 ENCOUNTER — Encounter (HOSPITAL_COMMUNITY): Payer: Self-pay | Admitting: Family Medicine

## 2023-06-24 DIAGNOSIS — E119 Type 2 diabetes mellitus without complications: Secondary | ICD-10-CM

## 2023-06-24 DIAGNOSIS — L03312 Cellulitis of back [any part except buttock]: Secondary | ICD-10-CM

## 2023-06-24 DIAGNOSIS — I10 Essential (primary) hypertension: Secondary | ICD-10-CM | POA: Diagnosis present

## 2023-06-24 DIAGNOSIS — F329 Major depressive disorder, single episode, unspecified: Secondary | ICD-10-CM | POA: Diagnosis present

## 2023-06-24 DIAGNOSIS — J449 Chronic obstructive pulmonary disease, unspecified: Secondary | ICD-10-CM | POA: Diagnosis present

## 2023-06-24 DIAGNOSIS — Z5901 Sheltered homelessness: Secondary | ICD-10-CM | POA: Diagnosis not present

## 2023-06-24 DIAGNOSIS — R1011 Right upper quadrant pain: Secondary | ICD-10-CM | POA: Diagnosis present

## 2023-06-24 DIAGNOSIS — Z833 Family history of diabetes mellitus: Secondary | ICD-10-CM | POA: Diagnosis not present

## 2023-06-24 DIAGNOSIS — F17210 Nicotine dependence, cigarettes, uncomplicated: Secondary | ICD-10-CM | POA: Diagnosis present

## 2023-06-24 DIAGNOSIS — L0390 Cellulitis, unspecified: Secondary | ICD-10-CM | POA: Diagnosis present

## 2023-06-24 DIAGNOSIS — Z22322 Carrier or suspected carrier of Methicillin resistant Staphylococcus aureus: Secondary | ICD-10-CM | POA: Diagnosis not present

## 2023-06-24 DIAGNOSIS — J45909 Unspecified asthma, uncomplicated: Secondary | ICD-10-CM | POA: Diagnosis present

## 2023-06-24 DIAGNOSIS — E1165 Type 2 diabetes mellitus with hyperglycemia: Secondary | ICD-10-CM | POA: Diagnosis present

## 2023-06-24 DIAGNOSIS — Z79899 Other long term (current) drug therapy: Secondary | ICD-10-CM | POA: Diagnosis not present

## 2023-06-24 DIAGNOSIS — R222 Localized swelling, mass and lump, trunk: Secondary | ICD-10-CM | POA: Diagnosis present

## 2023-06-24 DIAGNOSIS — L02212 Cutaneous abscess of back [any part, except buttock]: Secondary | ICD-10-CM | POA: Diagnosis present

## 2023-06-24 DIAGNOSIS — R109 Unspecified abdominal pain: Secondary | ICD-10-CM | POA: Diagnosis present

## 2023-06-24 DIAGNOSIS — Z23 Encounter for immunization: Secondary | ICD-10-CM | POA: Diagnosis not present

## 2023-06-24 DIAGNOSIS — T63301A Toxic effect of unspecified spider venom, accidental (unintentional), initial encounter: Secondary | ICD-10-CM | POA: Diagnosis present

## 2023-06-24 DIAGNOSIS — Z7984 Long term (current) use of oral hypoglycemic drugs: Secondary | ICD-10-CM | POA: Diagnosis not present

## 2023-06-24 LAB — GLUCOSE, CAPILLARY
Glucose-Capillary: 103 mg/dL — ABNORMAL HIGH (ref 70–99)
Glucose-Capillary: 226 mg/dL — ABNORMAL HIGH (ref 70–99)
Glucose-Capillary: 268 mg/dL — ABNORMAL HIGH (ref 70–99)
Glucose-Capillary: 292 mg/dL — ABNORMAL HIGH (ref 70–99)
Glucose-Capillary: 478 mg/dL — ABNORMAL HIGH (ref 70–99)
Glucose-Capillary: 482 mg/dL — ABNORMAL HIGH (ref 70–99)

## 2023-06-24 LAB — BASIC METABOLIC PANEL
Anion gap: 12 (ref 5–15)
BUN: <5 mg/dL — ABNORMAL LOW (ref 6–20)
CO2: 24 mmol/L (ref 22–32)
Calcium: 8.7 mg/dL — ABNORMAL LOW (ref 8.9–10.3)
Chloride: 99 mmol/L (ref 98–111)
Creatinine, Ser: 0.61 mg/dL (ref 0.44–1.00)
GFR, Estimated: >60 mL/min (ref >60)
Glucose, Bld: 283 mg/dL — ABNORMAL HIGH (ref 70–99)
Potassium: 3.4 mmol/L — ABNORMAL LOW (ref 3.5–5.1)
Sodium: 135 mmol/L (ref 135–145)

## 2023-06-24 LAB — LIPID PANEL
Cholesterol: 135 mg/dL (ref 0–200)
HDL: 36 mg/dL — ABNORMAL LOW (ref >40)
LDL Cholesterol: 76 mg/dL (ref 0–99)
Total CHOL/HDL Ratio: 3.8 RATIO
Triglycerides: 117 mg/dL (ref <150)
VLDL: 23 mg/dL (ref 0–40)

## 2023-06-24 LAB — CBC
HCT: 41.3 % (ref 36.0–46.0)
Hemoglobin: 13.9 g/dL (ref 12.0–15.0)
MCH: 30 pg (ref 26.0–34.0)
MCHC: 33.7 g/dL (ref 30.0–36.0)
MCV: 89.2 fL (ref 80.0–100.0)
Platelets: 367 10*3/uL (ref 150–400)
RBC: 4.63 MIL/uL (ref 3.87–5.11)
RDW: 12.2 % (ref 11.5–15.5)
WBC: 16.6 10*3/uL — ABNORMAL HIGH (ref 4.0–10.5)
nRBC: 0 % (ref 0.0–0.2)

## 2023-06-24 LAB — MAGNESIUM: Magnesium: 1.7 mg/dL (ref 1.7–2.4)

## 2023-06-24 LAB — HEMOGLOBIN A1C
Hgb A1c MFr Bld: 12.9 % — ABNORMAL HIGH (ref 4.8–5.6)
Mean Plasma Glucose: 323.53 mg/dL

## 2023-06-24 LAB — LIPASE, BLOOD: Lipase: 17 U/L (ref 11–51)

## 2023-06-24 LAB — HIV ANTIBODY (ROUTINE TESTING W REFLEX): HIV Screen 4th Generation wRfx: NONREACTIVE

## 2023-06-24 MED ORDER — LACTATED RINGERS IV BOLUS
1000.0000 mL | Freq: Once | INTRAVENOUS | Status: AC
  Administered 2023-06-24: 1000 mL via INTRAVENOUS

## 2023-06-24 MED ORDER — POTASSIUM CHLORIDE CRYS ER 20 MEQ PO TBCR
40.0000 meq | EXTENDED_RELEASE_TABLET | Freq: Once | ORAL | Status: AC
  Administered 2023-06-24: 40 meq via ORAL
  Filled 2023-06-24: qty 2

## 2023-06-24 MED ORDER — GABAPENTIN 300 MG PO CAPS
300.0000 mg | ORAL_CAPSULE | Freq: Every day | ORAL | Status: DC
  Administered 2023-06-24 – 2023-06-27 (×4): 300 mg via ORAL
  Filled 2023-06-24 (×4): qty 1

## 2023-06-24 MED ORDER — FLUOXETINE HCL 20 MG PO CAPS
20.0000 mg | ORAL_CAPSULE | Freq: Every day | ORAL | Status: DC
  Administered 2023-06-24 – 2023-06-28 (×5): 20 mg via ORAL
  Filled 2023-06-24 (×6): qty 1

## 2023-06-24 MED ORDER — ACETAMINOPHEN 325 MG PO TABS: 650.0000 mg | ORAL_TABLET | Freq: Four times a day (QID) | ORAL | Status: DC | PRN

## 2023-06-24 MED ORDER — ACETAMINOPHEN 650 MG RE SUPP
650.0000 mg | Freq: Four times a day (QID) | RECTAL | Status: DC
  Filled 2023-06-24 (×3): qty 1

## 2023-06-24 MED ORDER — ACETAMINOPHEN 650 MG RE SUPP: 650.0000 mg | Freq: Four times a day (QID) | RECTAL | Status: DC | PRN

## 2023-06-24 MED ORDER — IBUPROFEN 200 MG PO TABS
600.0000 mg | ORAL_TABLET | Freq: Four times a day (QID) | ORAL | Status: DC
  Administered 2023-06-24 – 2023-06-28 (×17): 600 mg via ORAL
  Filled 2023-06-24 (×17): qty 3

## 2023-06-24 MED ORDER — VANCOMYCIN HCL 750 MG/150ML IV SOLN
750.0000 mg | Freq: Two times a day (BID) | INTRAVENOUS | Status: DC
  Administered 2023-06-24 – 2023-06-28 (×9): 750 mg via INTRAVENOUS
  Filled 2023-06-24 (×9): qty 150

## 2023-06-24 MED ORDER — ACETAMINOPHEN 325 MG PO TABS
650.0000 mg | ORAL_TABLET | Freq: Four times a day (QID) | ORAL | Status: DC
  Administered 2023-06-24 – 2023-06-27 (×12): 650 mg via ORAL
  Filled 2023-06-24 (×12): qty 2

## 2023-06-24 MED ORDER — INSULIN ASPART 100 UNIT/ML IJ SOLN
0.0000 [IU] | Freq: Three times a day (TID) | INTRAMUSCULAR | Status: DC
  Administered 2023-06-25 (×3): 11 [IU] via SUBCUTANEOUS
  Administered 2023-06-26: 15 [IU] via SUBCUTANEOUS
  Administered 2023-06-26: 20 [IU] via SUBCUTANEOUS
  Administered 2023-06-27: 11 [IU] via SUBCUTANEOUS
  Administered 2023-06-27: 4 [IU] via SUBCUTANEOUS
  Administered 2023-06-27: 3 [IU] via SUBCUTANEOUS
  Administered 2023-06-28: 11 [IU] via SUBCUTANEOUS
  Administered 2023-06-28: 7 [IU] via SUBCUTANEOUS

## 2023-06-24 MED ORDER — METFORMIN HCL 500 MG PO TABS: 500.0000 mg | ORAL_TABLET | Freq: Two times a day (BID) | ORAL | Status: DC

## 2023-06-24 MED ORDER — ONDANSETRON HCL 4 MG PO TABS
8.0000 mg | ORAL_TABLET | Freq: Once | ORAL | Status: AC
  Administered 2023-06-24: 8 mg via ORAL
  Filled 2023-06-24: qty 2

## 2023-06-24 MED ORDER — INSULIN ASPART 100 UNIT/ML IJ SOLN
0.0000 [IU] | Freq: Three times a day (TID) | INTRAMUSCULAR | Status: DC
  Administered 2023-06-24: 15 [IU] via SUBCUTANEOUS
  Administered 2023-06-24: 5 [IU] via SUBCUTANEOUS

## 2023-06-24 MED ORDER — GABAPENTIN 300 MG PO CAPS
300.0000 mg | ORAL_CAPSULE | Freq: Three times a day (TID) | ORAL | Status: DC
  Administered 2023-06-24: 300 mg via ORAL
  Filled 2023-06-24: qty 1

## 2023-06-24 MED ORDER — IOHEXOL 350 MG/ML SOLN
75.0000 mL | Freq: Once | INTRAVENOUS | Status: AC | PRN
  Administered 2023-06-24: 75 mL via INTRAVENOUS

## 2023-06-24 MED ORDER — INSULIN ASPART 100 UNIT/ML IJ SOLN
15.0000 [IU] | Freq: Once | INTRAMUSCULAR | Status: AC
  Administered 2023-06-24: 15 [IU] via SUBCUTANEOUS

## 2023-06-24 MED ORDER — TETANUS-DIPHTH-ACELL PERTUSSIS 5-2.5-18.5 LF-MCG/0.5 IM SUSY
0.5000 mL | PREFILLED_SYRINGE | Freq: Once | INTRAMUSCULAR | Status: AC
  Administered 2023-06-25: 0.5 mL via INTRAMUSCULAR
  Filled 2023-06-24: qty 0.5

## 2023-06-24 MED ORDER — OXYCODONE HCL 5 MG PO TABS
5.0000 mg | ORAL_TABLET | ORAL | Status: DC | PRN
  Administered 2023-06-24 – 2023-06-27 (×9): 5 mg via ORAL
  Filled 2023-06-24 (×10): qty 1

## 2023-06-24 NOTE — Assessment & Plan Note (Addendum)
Glucose 249 in the ED.  Patient takes metformin 500 mg twice daily.  States that she has not been eating as much lately due to nausea and chills.  Suspect glucose elevation secondary to infection - Continue metformin 500 mg twice daily - Hemoglobin A1c - Check CBG with meals and at bedtime

## 2023-06-24 NOTE — Plan of Care (Signed)

## 2023-06-24 NOTE — Progress Notes (Signed)
Physical Therapy Treatment Patient Details Name: Yolanda Hendricks MRN: [id number] DOB: 1971/10/05 Today's Date: 06/24/2023   History of Present Illness Yolanda Hendricks is a 52 y.o. female presenting with back pain and swelling x 5 days after suspected bug bite.  History of type 2 diabetes, states her sugars have been running higher.    PT Comments  Pt presents with admitting diagnosis above. Pt today was able to ambulate in the hallway independently no AD. Pt presents at or near baseline mobility. Pt has no further acute PT needs and will be signing off. Re consult PT if mobility status changes.    If plan is discharge home, recommend the following: Direct supervision/assist for medications management;Assistance with cooking/housework   Can travel by private vehicle        Equipment Recommendations  None recommended by PT    Recommendations for Other Services       Precautions / Restrictions Precautions Precautions: Fall Restrictions Weight Bearing Restrictions: No     Mobility  Bed Mobility Overal bed mobility: Modified Independent                  Transfers Overall transfer level: Independent Equipment used: None                    Ambulation/Gait Ambulation/Gait assistance: Independent Gait Distance (Feet): 150 Feet Assistive device: None Gait Pattern/deviations: WFL(Within Functional Limits)   Gait velocity interpretation: >2.62 ft/sec, indicative of community ambulatory   General Gait Details: no LOB noted.   Stairs             Wheelchair Mobility     Tilt Bed    Modified Rankin (Stroke Patients Only)       Balance Overall balance assessment: No apparent balance deficits (not formally assessed)                                          Cognition Arousal: Alert Behavior During Therapy: WFL for tasks assessed/performed Overall Cognitive Status: Within Functional Limits for tasks assessed                                           Exercises      General Comments General comments (skin integrity, edema, etc.): VSS      Pertinent Vitals/Pain Pain Assessment Pain Assessment: 0-10 Pain Score: 5  Pain Location: R sided back pain Pain Descriptors / Indicators: Grimacing, Discomfort, Moaning Pain Intervention(s): Monitored during session, Limited activity within patient's tolerance    Home Living Family/patient expects to be discharged to:: Shelter/Homeless                        Prior Function            PT Goals (current goals can now be found in the care plan section) Acute Rehab PT Goals PT Goal Formulation: All assessment and education complete, DC therapy    Frequency           PT Plan      Co-evaluation              AM-PAC PT "6 Clicks" Mobility   Outcome Measure  Help needed turning from your back to your side while in a flat bed without using  bedrails?: None Help needed moving from lying on your back to sitting on the side of a flat bed without using bedrails?: None Help needed moving to and from a bed to a chair (including a wheelchair)?: None Help needed standing up from a chair using your arms (e.g., wheelchair or bedside chair)?: None Help needed to walk in hospital room?: None Help needed climbing 3-5 steps with a railing? : None 6 Click Score: 24    End of Session   Activity Tolerance: Patient tolerated treatment well Patient left: Other (comment) (In bathroom) Nurse Communication: Mobility status PT Visit Diagnosis: Other abnormalities of gait and mobility (R26.89)     Time: 1610-9604 PT Time Calculation (min) (ACUTE ONLY): 8 min  Charges:      PT General Charges $$ ACUTE PT VISIT: 1 Visit                     Shela Nevin, PT, DPT Acute Rehab Services [phone]    Gladys Damme 06/24/2023, 4:24 PM

## 2023-06-24 NOTE — ED Notes (Signed)
ED TO INPATIENT HANDOFF REPORT  ED Nurse Name and Phone #: Pearletha Forge RN [phone]  S Name/Age/Gender Yolanda Hendricks 52 y.o. female Room/Bed: 011C/011C  Code Status   Code Status: Full Code  Home/SNF/Other Home Patient oriented to: self, place, time, and situation Is this baseline? Yes   Triage Complete: Triage complete  Chief Complaint Cellulitis of back [L03.312]  Triage Note PER EMS: pt presents with large wound to the right side of her back with purulent drainage that has progressively enlarged over the past 5 days. She thinks it could be a spider bite but she did not see a spider. She repots nausea and feeling sick, states she has not eaten in 2 days.   BP- 134/92, HR-102, 99% RA, CBG-253 20g RAC   Allergies No Known Allergies  Level of Care/Admitting Diagnosis ED Disposition     ED Disposition  Admit   Condition  --   Comment  Hospital Area: [facility] [[id number]]  Level of Care: Med-Surg [16]  May place patient in observation at [facility] or [facility] if equivalent level of care is available:: No  Covid Evaluation: Asymptomatic - no recent exposure (last 10 days) testing not required  Diagnosis: Cellulitis of back [454098]  Admitting Physician: Para March [[id number]]  Attending Physician: Caro Laroche [[id number]]          B Medical/Surgery History Past Medical History:  Diagnosis Date   Asthma    Diabetes mellitus without complication (HCC)    Past Surgical History:  Procedure Laterality Date   EYE SURGERY     TUBAL LIGATION       A IV Location/Drains/Wounds Patient Lines/Drains/Airways Status     Active Line/Drains/Airways     Name Placement date Placement time Site Days   Peripheral IV 06/23/23 20 G Left Antecubital 06/23/23  2116  Antecubital  1   Peripheral IV 06/23/23 20 G Posterior;Right Hand 06/23/23  2117  Hand  1            Intake/Output Last 24 hours No intake or output data in the 24 hours  ending 06/24/23 0012  Labs/Imaging Results for orders placed or performed during the hospital encounter of 06/23/23 (from the past 48 hour(s))  Comprehensive metabolic panel     Status: Abnormal   Collection Time: 06/23/23  9:08 PM  Result Value Ref Range   Sodium 136 135 - 145 mmol/L   Potassium 3.6 3.5 - 5.1 mmol/L   Chloride 93 (L) 98 - 111 mmol/L   CO2 24 22 - 32 mmol/L   Glucose, Bld 249 (H) 70 - 99 mg/dL    Comment: Glucose reference range applies only to samples taken after fasting for at least 8 hours.   BUN 6 6 - 20 mg/dL   Creatinine, Ser 0.86 0.44 - 1.00 mg/dL   Calcium 9.6 8.9 - 57.8 mg/dL   Total Protein 8.4 (H) 6.5 - 8.1 g/dL   Albumin 3.4 (L) 3.5 - 5.0 g/dL   AST 17 15 - 41 U/L   ALT 12 0 - 44 U/L   Alkaline Phosphatase 143 (H) 38 - 126 U/L   Total Bilirubin 0.7 0.3 - 1.2 mg/dL   GFR, Estimated >46 >96 mL/min    Comment: (NOTE) Calculated using the CKD-EPI Creatinine Equation (2021)    Anion gap 19 (H) 5 - 15    Comment: Performed at [facility], [address], [place], [place] [address]  CBC with Differential  Status: Abnormal   Collection Time: 06/23/23  9:08 PM  Result Value Ref Range   WBC 16.2 (H) 4.0 - 10.5 K/uL   RBC 5.17 (H) 3.87 - 5.11 MIL/uL   Hemoglobin 15.9 (H) 12.0 - 15.0 g/dL   HCT 84.1 (H) 66.0 - 63.0 %   MCV 91.9 80.0 - 100.0 fL   MCH 30.8 26.0 - 34.0 pg   MCHC 33.5 30.0 - 36.0 g/dL   RDW 16.0 10.9 - 32.3 %   Platelets 360 150 - 400 K/uL   nRBC 0.0 0.0 - 0.2 %   Neutrophils Relative % 86 %   Neutro Abs 14.0 (H) 1.7 - 7.7 K/uL   Lymphocytes Relative 5 %   Lymphs Abs 0.9 0.7 - 4.0 K/uL   Monocytes Relative 6 %   Monocytes Absolute 1.0 0.1 - 1.0 K/uL   Eosinophils Relative 1 %   Eosinophils Absolute 0.1 0.0 - 0.5 K/uL   Basophils Relative 1 %   Basophils Absolute 0.1 0.0 - 0.1 K/uL   Immature Granulocytes 1 %   Abs Immature Granulocytes 0.10 (H) 0.00 - 0.07 K/uL    Comment: Performed at [facility], [address], [place], [place] [address]  I-Stat Lactic Acid     Status: None   Collection Time: 06/23/23  9:27 PM  Result Value Ref Range   Lactic Acid, Venous 1.0 0.5 - 1.9 mmol/L   No results found.  Pending Labs Unresulted Labs (From admission, onward)     Start     Ordered   06/24/23 0500  CBC  Tomorrow morning,   R        06/24/23 0001   06/24/23 0500  Basic metabolic panel  Tomorrow morning,   R        06/24/23 0001   06/24/23 0500  Hemoglobin A1c  Tomorrow morning,   R        06/24/23 0001   06/23/23 2354  HIV Antibody (routine testing w rflx)  (HIV Antibody (Routine testing w reflex) panel)  Once,   R        06/24/23 0001   06/23/23 2108  Culture, blood (routine x 2)  BLOOD CULTURE X 2,   R (with STAT occurrences)      06/23/23 2107   Pending  Aerobic Culture w Gram Stain (superficial specimen)  Once,   R        Pending            Vitals/Pain Today's Vitals   06/23/23 2315 06/23/23 2316 06/23/23 2330 06/23/23 2345  BP:  105/75 122/75 115/69  Pulse:  96 95 (!) 102  Resp:  16    Temp:      TempSrc:      SpO2:  100% 96% 100%  Weight:      Height:      PainSc: 0-No pain       Isolation Precautions No active isolations  Medications Medications  vancomycin (VANCOCIN) IVPB 1000 mg/200 mL premix (1,000 mg Intravenous New Bag/Given 06/23/23 2315)  FLUoxetine (PROZAC) capsule 20 mg (has no administration in time range)  metFORMIN (GLUCOPHAGE) tablet 500 mg (has no administration in time range)  gabapentin (NEURONTIN) capsule 300 mg (has no administration in time range)  acetaminophen (TYLENOL) tablet 650 mg (has no administration in time range)    Or  acetaminophen (TYLENOL) suppository 650 mg (has no administration in time range)  oxyCODONE (Oxy IR/ROXICODONE) immediate release tablet 5 mg (has no administration  in time range)  fentaNYL (SUBLIMAZE) injection 50 mcg (50 mcg Intravenous Given 06/23/23 2257)  ketorolac (TORADOL) 30 MG/ML injection 15 mg (15 mg Intravenous Given  06/23/23 2257)  sodium chloride 0.9 % bolus 1,000 mL (1,000 mLs Intravenous New Bag/Given 06/23/23 2329)  iohexol (OMNIPAQUE) 350 MG/ML injection 75 mL (75 mLs Intravenous Contrast Given 06/24/23 0007)    Mobility walks     Focused Assessments Patient has wound on the right lower back x 3 days. Pictures uploaded to patient chart.    R Recommendations: See Admitting Provider Note  Report given to:   Additional Notes:

## 2023-06-24 NOTE — Inpatient Diabetes Management (Signed)
Inpatient Diabetes Program Recommendations  AACE/ADA: New Consensus Statement on Inpatient Glycemic Control (2015)  Target Ranges:  Prepandial:   less than 140 mg/dL      Peak postprandial:   less than 180 mg/dL (1-2 hours)      Critically ill patients:  140 - 180 mg/dL   Lab Results  Component Value Date   GLUCAP 226 (H) 06/24/2023   HGBA1C 12.9 (H) 06/24/2023    Review of Glycemic Control  Diabetes history: DM 2 Outpatient Diabetes medications: Metformin 500 mg bid Current orders for Inpatient glycemic control:  Novolog 0-15 units tid  A1c 12.9% on 8/22  Pt homeless but reports she does have access to refrigeration. Pt reports her father can get her and let her take her insulin everyday. Pt reports her father also has diabetes.  Note: pt with nerd candy and cheez-its at bedside a large plate of spaghetti  Pt does not have consistent PO intake so I would suggest a very low dose of basal insulin potentially at time of d/c. Will monitor her glucose trends on correction scale first and determine insulin needs at discharge. Pt reports having a voucher for housing but has to find a location first. Then she will be able to have more consistent meal intake.  Pt is use to vial and syringe administration of insulin  Spoke w/pt at bedside and discussed current A1c. Reviewed glucose and A1c goals. Informed pt of inpatient goals that we would use for glucose control in the hospital. There is a chance that her hyperglycemia is only related to intake. If this is the case, would maybe need a starlix or prandin before eat potential meal consumed to coverage carbohydrate intake.    Thanks,  Christena Deem RN, MSN, BC-ADM Inpatient Diabetes Coordinator Team Pager [phone] (8a-5p)

## 2023-06-24 NOTE — Assessment & Plan Note (Addendum)
Wound drainage persists, leukocytosis persists.  No appreciable abscess to drain.  Likely poor healing in the setting of poorly controlled diabetes. - Continue Vancomycin per pharmacy -Follow-up blood culture, superficial specimen culture - Consider narrowing antibiotic coverage as blood cultures return - Continue observation and monitor vital signs - BMP and CBC daily - Continue managing blood glucose with goal of <180 to aid in wound healing - Ibuprofen 600 mg q6h and Tylenol 650 mg q6h scheduled, Oxycodone 5 mg q4h PRN for pain -Reduced gabapentin to 300 mg daily -Zofran 8 mg once -Tdap vaccine

## 2023-06-24 NOTE — Hospital Course (Addendum)
Yolanda Hendricks is a 52 y.o.female with a history of T2DM, asthma who was admitted to the [facility] Medicine Teaching Service at [facility] for back pain/swelling 2/2 draining wound. Her hospital course is detailed below:  Cellulitis Initially presented with back pain and swelling with a draining wound after suspected bug bite and attempted self-lancing by patient's friend. CT imaging was suggestive of cellulitis, and she was started on Vancomycin in the ED. Leukocytosis improved with IV antibiotic treatment.. Blood cx unremarkable after 2 days. Surgery was consulted and performed I&D on 8/24. She remained afebrile and hemodynamically stable throughout her admission. Wound cx grew abundant Staph Aureus. Transitioned antibiotics from IV vancomycin to doxycycline based on susceptibilities. She was advised to continue BID dressing changes.   T2DM Poorly controlled diabetes with HbA1c of 12.9 on admission. Pt on home metformin 500 BID, though mostly nonadherent s/t social stressors (homelessness). Diabetes managed inpatient with insulin. Insulin regimen was titrated with final discharge regimen being Glargine 20 units basal and she was restarted on metformin 500 mg BID.    Other chronic conditions were medically managed with home medications and formulary alternatives as necessary (MDD)  MDD: PROZAC 20 mg once daily  PCP Follow-up Recommendations: Started basal insulin inpatient. Did not send short acting due to insulin naivete. Consider adding jardiance and/or glp-1 for additional mortality benefit and glucose control.  Recheck BMP and Magnesium at follow-up appointment to follow up hypomagnesemia.

## 2023-06-24 NOTE — Progress Notes (Addendum)
Daily Progress Note Intern Pager: [phone]  Patient name: Yolanda Hendricks Medical record number: [id number] Date of birth: [birth year] Age: 52 y.o. Gender: female  Primary Care Provider: Penne Lash, MD Consultants: Infectious Disease Code Status: Full  Pt Overview and Major Events to Date:  8/21: Patient admitted, started on IV Vanc  Assessment and Plan: [name] is a 52 y.o. female presenting with back pain and swelling for approximately  the past week after suspected bug bite.  Receiving treatment for cellulitis currently.  Her diabetes remains poorly controlled as well.  Past medical history consists of MDD, T2DM, asthma, homelessness. Assessment & Plan Cellulitis of back Wound drainage persists, leukocytosis persists.  No appreciable abscess to drain.  Likely poor healing in the setting of poorly controlled diabetes. - Continue Vancomycin per pharmacy -Follow-up blood culture, superficial specimen culture - Consider narrowing antibiotic coverage as blood cultures return - Continue observation and monitor vital signs - BMP and CBC daily - Continue managing blood glucose with goal of <180 to aid in wound healing - Ibuprofen 600 mg q6h and Tylenol 650 mg q6h scheduled, Oxycodone 5 mg q4h PRN for pain -Reduced gabapentin to 300 mg daily -Zofran 8 mg once -Tdap vaccine Type II diabetes mellitus (HCC) Fasting CBG 283. Patient previously prescribed metformin 500 mg twice daily but reports inconsistent use. States that she has not been eating as much lately due to nausea and chills.  Suspect glucose elevation secondary to infection and poorly managed diabetes.  A1c 12.9. - Discontinued metformin, placed on moderate sliding scale insulin - Check CBG with meals and at bedtime Abdominal pain CT revealed subcutaneous soft tissue stranding and thickening in lower chest and upper abdomen consistent with possible myositis. Possibly related to poorly managed diabetes, further  testing needed to evaluate for possible gallstones or pancreatitis. - Ordered RUQ ultrasound - Ordered lipase   Chronic, stable conditions:  MDD: continue prozac 20mg  daily  FEN/GI: Regular PPx: None: Padua of 1 Dispo: Pending antibiotic management and discussion regarding previous homelessness status and safety. PT eval ordered, appreciate recommendations as well.   Subjective:   Yolanda Hendricks is a 52 y.o. female presenting with pain and swelling in her back for the past week. She states that she believes that she was bitten by a bug because she was sleeping on a couch taken from the street, and she had noticed some bugs on it. Her friend tried to cut open the wound on her back with a knife on 8/20, but no drainage was expressed. Since noticing the wound, she reports that it has become increasingly more swollen and painful.  She has been eating very little due to nausea, but denies any vomitting or diarrhea. She additionally endorses intense abdominal pain, most prominent in her RUQ, described as sharp and worsening with movement. She reports that she is currently homeless but staying with a friend. She has a history of type 2 diabetes, and states that her blood sugar typically runs between 200 - 300, and A1c performed in ED was 12.9.  Objective: Temp:  [98.1 F (36.7 C)-98.9 F (37.2 C)] 98.1 F (36.7 C) (08/22 1352) Pulse Rate:  [92-111] 101 (08/22 1352) Resp:  [16-19] 17 (08/22 1352) BP: (105-130)/(56-86) 108/69 (08/22 1352) SpO2:  [96 %-100 %] 96 % (08/22 1352) Weight:  [59 kg] 59 kg (08/21 2041)  Physical Exam: General: Alert and Oriented, ill appearing, mildly distressed Cardiovascular: Regular rhythm, tachycardic. No murmurs, rubs, or gallops. Normal S1/ S2 Respiratory: Lungs  clear to auscultation bilaterally. No wheezing, rales, or rhonchi present  Abdomen: RUQ tenderness, normoactive bowel sounds, no bruising or erythema present Extremities: No edema  present  Laboratory: Most recent CBC Lab Results  Component Value Date   WBC 16.6 (H) 06/24/2023   HGB 13.9 06/24/2023   HCT 41.3 06/24/2023   MCV 89.2 06/24/2023   PLT 367 06/24/2023   Most recent BMP    Latest Ref Rng & Units 06/24/2023    8:07 AM  BMP  Glucose 70 - 99 mg/dL 409   BUN 6 - 20 mg/dL <5   Creatinine 8.11 - 1.00 mg/dL 9.14   Sodium 782 - 956 mmol/L 135   Potassium 3.5 - 5.1 mmol/L 3.4   Chloride 98 - 111 mmol/L 99   CO2 22 - 32 mmol/L 24   Calcium 8.9 - 10.3 mg/dL 8.7    O1H: 08.6 LDL 76  Imaging/Diagnostic Tests:  CT abdomen pelvis w/ contrast: IMPRESSION: Subcutaneous soft tissue stranding and thickening in the right back in the lower chest and upper abdomen. Underlying musculature is thickened. This could reflect cellulitis and myositis.   Tonye Pearson, OMS IV 06/24/2023, 4:40 PM  [facility] FPTS Intern pager: [phone], text pages welcome Secure chat group [facility] Teaching Service    I was personally present and performed or re-performed the history, physical exam and medical decision making activities of this service and have verified that the service and findings are accurately documented in the student's note.  Shelby Mattocks, DO                  06/24/2023, 4:42 PM

## 2023-06-24 NOTE — Progress Notes (Signed)
Pharmacy Antibiotic Note  Yolanda Hendricks is a 52 y.o. female admitted on 06/23/2023 with cellulitis/myositis.  Pharmacy has been consulted for Vancomycin dosing. WBC is elevated. Renal function good. CT with no discernable abscess.   Plan: Vancomycin 750 mg IV q12h >>>Estimated AUC: 483 Trend WBC, temp, renal function  F/U infectious work-up Drug levels as indicated   Height: 5\' 7"  (170.2 cm) Weight: 59 kg (130 lb) IBW/kg (Calculated) : 61.6  Temp (24hrs), Avg:98.5 F (36.9 C), Min:98.3 F (36.8 C), Max:98.7 F (37.1 C)  Recent Labs  Lab 06/23/23 2108 06/23/23 2127  WBC 16.2*  --   CREATININE 0.62  --   LATICACIDVEN  --  1.0    Estimated Creatinine Clearance: 79.2 mL/min (by C-G formula based on SCr of 0.62 mg/dL).    No Known Allergies  Abran Duke, PharmD, BCPS Clinical Pharmacist Phone: [phone]

## 2023-06-24 NOTE — Assessment & Plan Note (Addendum)
Fasting CBG 283. Patient previously prescribed metformin 500 mg twice daily but reports inconsistent use. States that she has not been eating as much lately due to nausea and chills.  Suspect glucose elevation secondary to infection and poorly managed diabetes.  A1c 12.9. - Discontinued metformin, placed on moderate sliding scale insulin - Check CBG with meals and at bedtime

## 2023-06-24 NOTE — Assessment & Plan Note (Addendum)
Presented with back pain and swelling 2/2 suspected bug bite.  Leukocytosis 16.2 in the ED, afebrile but tachycardic. Lactic WNL.  Suspect this is cellulitis.  - placed in observation, med-surg - Vancomycin started in ED, will continue - Blood cx sent, aerobic cx with gram stain sent - CT AP - BMP and CBC in am

## 2023-06-24 NOTE — Plan of Care (Signed)

## 2023-06-24 NOTE — H&P (Addendum)
Hospital Admission History and Physical Service Pager: [phone]  Patient name: Yolanda Hendricks Medical record number: [id number] Date of Birth: 1971-08-22 Age: 52 y.o. Gender: female  Primary Care Provider: Penne Lash, MD Code Status: Full   Chief Complaint: back pain and swelling 2/2 suspected cellulitis  Assessment and Plan: Yolanda Hendricks is a 52 y.o. female presenting with back pain and swelling x 5 days after suspected bug bite. Differential for presentation of this includes cellulitis, abscess, deep space infection. Suspect this to be cellulitis given warmth, erythema, swelling, and leukocytosis of 16.2. Wound is actively draining purulent material on exam. Remain vigilant for concomitant bacteremia with nausea and chills, but vitals reassuring at present.    [facility]     * (Principal) Cellulitis of back     Presented with back pain and swelling 2/2 suspected bug bite.   Leukocytosis 16.2 in the ED, afebrile but tachycardic. Lactic WNL.   Suspect this is a localized infection, CT ordered by EDP to evaluate for  underlying abscess vs phlegmon.  - placed in observation, med-surg - Vancomycin started in ED - Blood cx sent, aerobic cx with gram stain sent - CT AP, if well-defined abscess formation will plan for I&D - BMP and CBC in am - Given secondary wound with ?knife vs razor, seems prudent to cover for  tetanus. Patient cannot recall her last TDaP. Per chart review, was  overdue in 2020 with plans to get this done at health department, but she  does not remember having this done. Therefore will administer TDaP here.         Type II diabetes mellitus (HCC)     Glucose 249 in the ED.  Patient takes metformin 500 mg twice daily.   States that she has not been eating as much lately due to nausea and  chills.  Suspect glucose elevation secondary to infection - Continue metformin 500 mg twice daily - Hemoglobin A1c - Check CBG with meals and at bedtime          Chronic and Stable Problems:  Asthma on PRN albuterol   FEN/GI: regular VTE Prophylaxis: none - padua of 1  Disposition: medsurg, observation  History of Present Illness:  Yolanda Hendricks is a 52 y.o. female presenting with pain and swelling in her back x 5 days.  Patient states that she thinks she got bit by some type of bug.  States that her friend tried to cut it open with a knife but no drainage was expressed.  States it has become increasingly more swollen and painful.  She has not been eating due to nausea and chills.  History of type 2 diabetes, states her sugars have been running higher.  In the ED, patient's white count was 16.2.  Lactic acid normal. Glucose 249. CT abdomen pelvis was ordered.  Blood cultures sent, pt started on vancomycin. Wound culture was collected and sent.   Review Of Systems: Per HPI with the following additions:  Pertinent Past Medical History: Asthma - albuterol PRN T2DM - metformin 500mg  BID Remainder reviewed in history tab.   Pertinent Past Surgical History: Tubal ligation  Remainder reviewed in history tab.  Pertinent Social History: Tobacco use: yes, unclear on how much she smokes Alcohol use: occasional Other Substance use: none  Pertinent Family History: none  Remainder reviewed in history tab.   Important Outpatient Medications: Prozac 20mg  daily Gabapentin 300mg  TID Metformin 500mg  BID  Remainder reviewed in medication history.  Objective: BP 109/75 (BP Location: Left Arm)   Pulse 92   Temp 98.5 F (36.9 C) (Oral)   Resp 18   Ht 5\' 7"  (1.702 m)   Wt 59 kg   LMP 06/23/2019 (Approximate)   SpO2 100%   BMI 20.36 kg/m  Exam: General: No acute distress, very sleepy from pain medicine Eyes: EOMI ENTM: Moist mucous membranes Cardiovascular: tachycardic at 102 Respiratory: no increased work of breathing Neuro: A&Ox3 Psych: Normal affect Derm: Posterior right flank with approximately 12cm of erythema, swelling,  warmth. Wound actively draining yellow purulence.     Labs:  CBC BMET  Recent Labs  Lab 06/23/23 2108  WBC 16.2*  HGB 15.9*  HCT 47.5*  PLT 360   Recent Labs  Lab 06/23/23 2108  NA 136  K 3.6  CL 93*  CO2 24  BUN 6  CREATININE 0.62  GLUCOSE 249*  CALCIUM 9.6    Pertinent additional labs lactic acid 1.0 (WNL)   Imaging Studies Performed:  CT abdomen pelvis pending    Everhart, Kirstie, DO 06/24/2023, 2:16 AM PGY-1, [facility]  FPTS Intern pager: [phone], text pages welcome Secure chat group [facility] Teaching Service    I have evaluated this patient along with Dr. Rexene Alberts and reviewed the above note, making necessary revisions.  Dorothyann Gibbs, MD 06/24/2023, 2:16 AM PGY-3, [facility]

## 2023-06-24 NOTE — Assessment & Plan Note (Addendum)
CT revealed subcutaneous soft tissue stranding and thickening in lower chest and upper abdomen consistent with possible myositis. Possibly related to poorly managed diabetes, further testing needed to evaluate for possible gallstones or pancreatitis. - Ordered RUQ ultrasound - Ordered lipase

## 2023-06-25 ENCOUNTER — Other Ambulatory Visit (HOSPITAL_COMMUNITY): Payer: Self-pay

## 2023-06-25 DIAGNOSIS — L03312 Cellulitis of back [any part except buttock]: Secondary | ICD-10-CM | POA: Diagnosis not present

## 2023-06-25 LAB — CBC
HCT: 39.1 % (ref 36.0–46.0)
Hemoglobin: 13.1 g/dL (ref 12.0–15.0)
MCH: 30 pg (ref 26.0–34.0)
MCHC: 33.5 g/dL (ref 30.0–36.0)
MCV: 89.7 fL (ref 80.0–100.0)
Platelets: 343 10*3/uL (ref 150–400)
RBC: 4.36 MIL/uL (ref 3.87–5.11)
RDW: 12.1 % (ref 11.5–15.5)
WBC: 13.3 10*3/uL — ABNORMAL HIGH (ref 4.0–10.5)
nRBC: 0 % (ref 0.0–0.2)

## 2023-06-25 LAB — GLUCOSE, CAPILLARY
Glucose-Capillary: 288 mg/dL — ABNORMAL HIGH (ref 70–99)
Glucose-Capillary: 276 mg/dL — ABNORMAL HIGH (ref 70–99)
Glucose-Capillary: 277 mg/dL — ABNORMAL HIGH (ref 70–99)
Glucose-Capillary: 308 mg/dL — ABNORMAL HIGH (ref 70–99)

## 2023-06-25 LAB — COMPREHENSIVE METABOLIC PANEL
ALT: 10 U/L (ref 0–44)
AST: 16 U/L (ref 15–41)
Albumin: 2.5 g/dL — ABNORMAL LOW (ref 3.5–5.0)
Alkaline Phosphatase: 103 U/L (ref 38–126)
Anion gap: 10 (ref 5–15)
BUN: 5 mg/dL — ABNORMAL LOW (ref 6–20)
CO2: 23 mmol/L (ref 22–32)
Calcium: 8.7 mg/dL — ABNORMAL LOW (ref 8.9–10.3)
Chloride: 101 mmol/L (ref 98–111)
Creatinine, Ser: 0.52 mg/dL (ref 0.44–1.00)
GFR, Estimated: >60 mL/min (ref >60)
Glucose, Bld: 317 mg/dL — ABNORMAL HIGH (ref 70–99)
Potassium: 3.8 mmol/L (ref 3.5–5.1)
Sodium: 134 mmol/L — ABNORMAL LOW (ref 135–145)
Total Bilirubin: 0.4 mg/dL (ref 0.3–1.2)
Total Protein: 6.3 g/dL — ABNORMAL LOW (ref 6.5–8.1)

## 2023-06-25 LAB — MAGNESIUM: Magnesium: 1.6 mg/dL — ABNORMAL LOW (ref 1.7–2.4)

## 2023-06-25 MED ORDER — INSULIN GLARGINE-YFGN 100 UNIT/ML ~~LOC~~ SOLN
10.0000 [IU] | Freq: Every day | SUBCUTANEOUS | Status: DC
  Administered 2023-06-25: 10 [IU] via SUBCUTANEOUS
  Filled 2023-06-25: qty 0.1

## 2023-06-25 MED ORDER — MAGNESIUM SULFATE 2 GM/50ML IV SOLN
2.0000 g | Freq: Once | INTRAVENOUS | Status: AC
  Administered 2023-06-25: 2 g via INTRAVENOUS
  Filled 2023-06-25: qty 50

## 2023-06-25 MED ORDER — PNEUMOCOCCAL 20-VAL CONJ VACC 0.5 ML IM SUSY
0.5000 mL | PREFILLED_SYRINGE | INTRAMUSCULAR | Status: DC
  Filled 2023-06-25: qty 0.5

## 2023-06-25 NOTE — Progress Notes (Signed)
   06/25/23 1609  Mobility  Activity Ambulated independently in hallway  Level of Assistance Independent after set-up  Assistive Device Other (Comment) (IV Pole)  Distance Ambulated (ft) 600 ft  Activity Response Tolerated well  Mobility Referral Yes  $Mobility charge 1 Mobility  Mobility Specialist Start Time (ACUTE ONLY) 1550  Mobility Specialist Stop Time (ACUTE ONLY) 1608  Mobility Specialist Time Calculation (min) (ACUTE ONLY) 18 min   Mobility Specialist: Progress Note  Pt agreeable to mobility session - received in bed. No complaints, ambulated independently with IV Pole. Pt stated "it feels good to get up." Pt returned to chair with all needs met - call bell within reach.   Barnie Mort, BS Mobility Specialist Please contact via SecureChat or Rehab office at [phone].

## 2023-06-25 NOTE — Assessment & Plan Note (Signed)
Wound drainage persists, leukocytosis persists but improving (16.6 --> 13.3).  Continue to work towards better glycemic control to aid in wound healing - Continue Vancomycin per pharmacy - Follow-up blood culture, superficial specimen culture - Consider narrowing antibiotic coverage as blood cultures return - Continue observation and monitor vital signs - BMP and CBC daily - Ibuprofen 600 mg q6h and Tylenol 650 mg q6h scheduled, Oxycodone 5 mg q4h PRN for pain -Tdap vaccine ordered

## 2023-06-25 NOTE — Assessment & Plan Note (Signed)
Most recent CBG 317. Continue moderate sliding scale insulin regimen.  - Ordered glargine 10 units to aid in better glycemic control - Continue checking CBG with meals and at bedtime - Ordered magnesium repletion, recheck

## 2023-06-25 NOTE — Plan of Care (Signed)

## 2023-06-25 NOTE — Consult Note (Signed)
WOC Nurse Consult Note: Reason for Consult: Consult requested for right back.  Performed remotely after review of progress notes and photo in the EMR.  Right back with generalized cellulitis and edema and a pinhole opening with mod amt tan pus draining.  Secure chat message sent to the primary team as follows; "The WOC team has been consulted for this patient's back wound.  I reviewed the photo in Epic and feel that she needs a surgical consult to enlarge the opening and allow drainage of the pus.  Please request a consult from their team for further plan of care."  Please re-consult if further assistance is needed.  Thank-you,  Cammie Mcgee MSN, RN, CWOCN, [place], CNS [phone]

## 2023-06-25 NOTE — Anesthesia Preprocedure Evaluation (Signed)
Anesthesia Evaluation  Patient identified by MRN, date of birth, ID band Patient awake    Reviewed: Allergy & Precautions, NPO status , Patient's Chart, lab work & pertinent test results  History of Anesthesia Complications Negative for: history of anesthetic complications  Airway Mallampati: I  TM Distance: >3 FB Neck ROM: Full    Dental  (+) Edentulous Lower, Edentulous Upper   Pulmonary COPD,  COPD inhaler, Current Smoker and Patient abstained from smoking.   breath sounds clear to auscultation       Cardiovascular negative cardio ROS  Rhythm:Regular Rate:Normal     Neuro/Psych    Depression    negative neurological ROS     GI/Hepatic negative GI ROS, Neg liver ROS,,,  Endo/Other  diabetes (glu 322), Oral Hypoglycemic Agents    Renal/GU negative Renal ROS     Musculoskeletal   Abdominal   Peds  Hematology negative hematology ROS (+)   Anesthesia Other Findings   Reproductive/Obstetrics                             Anesthesia Physical Anesthesia Plan  ASA: 3  Anesthesia Plan: General   Post-op Pain Management: Tylenol PO (pre-op)*   Induction: Intravenous  PONV Risk Score and Plan: 2 and Ondansetron and Dexamethasone  Airway Management Planned: Oral ETT  Additional Equipment: None  Intra-op Plan:   Post-operative Plan: Extubation in OR  Informed Consent: I have reviewed the patients History and Physical, chart, labs and discussed the procedure including the risks, benefits and alternatives for the proposed anesthesia with the patient or authorized representative who has indicated his/her understanding and acceptance.       Plan Discussed with: CRNA and Surgeon  Anesthesia Plan Comments:         Anesthesia Quick Evaluation

## 2023-06-25 NOTE — Assessment & Plan Note (Signed)
Patient continues to report diffuse abdominal pain. Possibly related to poorly managed diabetes. Lipase unremarkable, RUQ US revealed no gallstones or wall thickening, although gallbladder was distended.

## 2023-06-25 NOTE — Progress Notes (Signed)
CSW spoke with pt regarding SDOH: Housing.  Pt confirms no stable housing, has been staying place to place with friends.  Pt has bee working with [facility], reports they gave her a voucher for [employer] but she has not found an apartment yet.  She is requesting phone numbers for both agencies to follow up.  CSW encouraged her to continue with these agencies as good option for stable housing.  Phone numbers provided. Daleen Squibb, MSW, LCSW 8/23/20243:11 PM

## 2023-06-25 NOTE — Progress Notes (Signed)
Daily Progress Note Intern Pager: [phone]  Patient name: Yolanda Hendricks Medical record number: [id number] Date of birth: August 12, 1971 Age: 52 y.o. Gender: female  Primary Care Provider: Penne Lash, MD Consultants: Infectious Disease Code Status: Full  Pt Overview and Major Events to Date:  8/21: Patient admitted, started on IV Vanc   Assessment and Plan: Bunita Stlaurent is a 52 y.o. female presenting with back pain and swelling for approximately  the past week after suspected bug bite.  Receiving treatment for cellulitis currently.  Her diabetes remains poorly controlled as well.  Most recent CBG measured 317, ordered glargine 10 units to aid in better glycemic control. Past medical history consists of MDD, T2DM, and homelessness. Currently in stable condition. Assessment & Plan Cellulitis of back Wound drainage persists, leukocytosis persists but improving (16.6 --> 13.3).  Continue to work towards better glycemic control to aid in wound healing - Continue Vancomycin per pharmacy - Follow-up blood culture, superficial specimen culture - Consider narrowing antibiotic coverage as blood cultures return - Continue observation and monitor vital signs - BMP and CBC daily - Ibuprofen 600 mg q6h and Tylenol 650 mg q6h scheduled, Oxycodone 5 mg q4h PRN for pain -Tdap vaccine ordered Type II diabetes mellitus (HCC) Most recent CBG 317. Continue moderate sliding scale insulin regimen.  - Ordered glargine 10 units to aid in better glycemic control - Continue checking CBG with meals and at bedtime - Ordered magnesium repletion, recheck  Abdominal pain (Resolved: 06/25/2023) Patient continues to report diffuse abdominal pain. Possibly related to poorly managed diabetes. Lipase unremarkable, RUQ US revealed no gallstones or wall thickening, although gallbladder was distended.   FEN/GI: Regular PPx: None - Padua score of 1 Dispo: Pending antibiotic management and discussion regarding  previous homelessness status and safety. PT eval ordered, appreciate recommendations as well.   Subjective:  Patient reports improvement of abdominal pain and decreased back pain over the wound. She endorses increased appetite this morning and was eating breakfast. She stated that she had not had a bowel movement in at least 2 days, and reported feelings of constipation.    Objective: Temp:  [98.1 F (36.7 C)-99 F (37.2 C)] 99 F (37.2 C) (08/23 0740) Pulse Rate:  [88-101] 93 (08/23 0740) Resp:  [17-18] 18 (08/23 0740) BP: (108-139)/(69-85) 121/75 (08/23 0740) SpO2:  [96 %-100 %] 100 % (08/23 0740)  Physical Exam: General: Alert and Oriented, no acute distressed Cardiovascular: Regular rate and rhythm. No murmurs, rubs, or gallops. Normal S1/ S2 Respiratory: Lungs clear to auscultation bilaterally. No wheezing, rales, or rhonchi present  Abdomen: No abdominal tenderness reported, normoactive bowel sounds, no bruising or erythema present Extremities: No edema present  Laboratory: Most recent CBC Lab Results  Component Value Date   WBC 13.3 (H) 06/25/2023   HGB 13.1 06/25/2023   HCT 39.1 06/25/2023   MCV 89.7 06/25/2023   PLT 343 06/25/2023   Most recent BMP    Latest Ref Rng & Units 06/25/2023    5:15 AM  BMP  Glucose 70 - 99 mg/dL 147   BUN 6 - 20 mg/dL 5   Creatinine 8.29 - 5.62 mg/dL 1.30   Sodium 865 - 784 mmol/L 134   Potassium 3.5 - 5.1 mmol/L 3.8   Chloride 98 - 111 mmol/L 101   CO2 22 - 32 mmol/L 23   Calcium 8.9 - 10.3 mg/dL 8.7    Dayle Points, Medical Student 06/25/2023, 7:45 AM   [facility] FPTS Intern pager:  [phone], text pages welcome Secure chat group [facility] Teaching Service     I have personally seen and examined this patient and reviewed their chart. I have discussed this patient with the resident. I agree with the assessment and plan as outlined.   Pain, nausea, wound size improving. Surgery consulted,  planning for I&D tomorrow. Continue Vanc until susceptibilities result. Continue uptitration of insulin regimen to attain better glycemic control.   Ellwood Dense, DO Family Medicine Teaching Service
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
## 2023-06-25 NOTE — TOC Benefit Eligibility Note (Signed)
Patient Product/process development scientist completed.    The patient is insured through [facility] MEDICAID.     Ran test claim for Lantus Pen and the current 30 day co-pay is $4.00.   This test claim was processed through [facility]- copay amounts may vary at other pharmacies due to pharmacy/plan contracts, or as the patient moves through the different stages of their insurance plan.     Yolanda Hendricks, CPHT Pharmacy Technician III Certified Patient Advocate [facility] Patient Advocate Team Direct Number: [phone]  Fax: [phone]

## 2023-06-25 NOTE — Inpatient Diabetes Management (Addendum)
Inpatient Diabetes Program Recommendations  AACE/ADA: New Consensus Statement on Inpatient Glycemic Control (2015)  Target Ranges:  Prepandial:   less than 140 mg/dL      Peak postprandial:   less than 180 mg/dL (1-2 hours)      Critically ill patients:  140 - 180 mg/dL   Lab Results  Component Value Date   GLUCAP 276 (H) 06/25/2023   HGBA1C 12.9 (H) 06/24/2023    Latest Reference Range & Units 06/24/23 16:04 06/24/23 17:46 06/24/23 19:58 06/25/23 07:37 06/25/23 11:43  Glucose-Capillary 70 - 99 mg/dL 409 (H) 811 (H) 914 (H) 288 (H) 276 (H)  (H): Data is abnormally high Review of Glycemic Control  Diabetes history: type 2 Outpatient Diabetes medications: Metformin 500 mg BID Current orders for Inpatient glycemic control: Semglee 10 units daily, Novolog 0-20 units correction scale TID  Inpatient Diabetes Program Recommendations:   Noted that blood sugars have been greater than 180 mg/dl. Just started the Semglee 10 units this am. Titrate dosages as needed.  If blood sugars continue to be elevated, recommend adding Novolog 3 units TID with meals if eating at least 50% of meal.  ADDENDUM:spoke with patient at the bedside. Patient states that she would like to use the insulin pen if affordable. Reviewed insulin pen dosing, how to use, needles. Patient returned demonstration without issues. Did well. Patient states that she will be going home with a friend. States that she got a voucher for housing and will be looking into that while at her friend's home.  NOTE: Benefit check for Lantus Solostar pen is $4.00.   Smith Mince RN BSN CDE Diabetes Coordinator Pager: [phone]  8am-5pm

## 2023-06-26 ENCOUNTER — Encounter (HOSPITAL_COMMUNITY): Payer: Self-pay | Admitting: Student

## 2023-06-26 ENCOUNTER — Inpatient Hospital Stay (HOSPITAL_COMMUNITY): Payer: Medicaid Other | Admitting: Anesthesiology

## 2023-06-26 ENCOUNTER — Other Ambulatory Visit: Payer: Self-pay

## 2023-06-26 ENCOUNTER — Encounter (HOSPITAL_COMMUNITY)
Admission: EM | Disposition: A | Discharge status: Home / Self Care | Payer: Self-pay | Source: Home / Self Care | Attending: Family Medicine

## 2023-06-26 DIAGNOSIS — L02212 Cutaneous abscess of back [any part, except buttock]: Secondary | ICD-10-CM | POA: Diagnosis not present

## 2023-06-26 DIAGNOSIS — L03312 Cellulitis of back [any part except buttock]: Secondary | ICD-10-CM | POA: Diagnosis not present

## 2023-06-26 DIAGNOSIS — F17210 Nicotine dependence, cigarettes, uncomplicated: Secondary | ICD-10-CM

## 2023-06-26 DIAGNOSIS — E119 Type 2 diabetes mellitus without complications: Secondary | ICD-10-CM | POA: Diagnosis not present

## 2023-06-26 DIAGNOSIS — I10 Essential (primary) hypertension: Secondary | ICD-10-CM

## 2023-06-26 HISTORY — PX: INCISION AND DRAINAGE OF WOUND: SHX1803

## 2023-06-26 LAB — CBC
HCT: 41.1 % (ref 36.0–46.0)
Hemoglobin: 13.5 g/dL (ref 12.0–15.0)
MCH: 30.2 pg (ref 26.0–34.0)
MCHC: 32.8 g/dL (ref 30.0–36.0)
MCV: 91.9 fL (ref 80.0–100.0)
Platelets: 370 10*3/uL (ref 150–400)
RBC: 4.47 MIL/uL (ref 3.87–5.11)
RDW: 12.3 % (ref 11.5–15.5)
WBC: 9.7 10*3/uL (ref 4.0–10.5)
nRBC: 0 % (ref 0.0–0.2)

## 2023-06-26 LAB — GLUCOSE, CAPILLARY
Glucose-Capillary: 322 mg/dL — ABNORMAL HIGH (ref 70–99)
Glucose-Capillary: 285 mg/dL — ABNORMAL HIGH (ref 70–99)
Glucose-Capillary: 248 mg/dL — ABNORMAL HIGH (ref 70–99)
Glucose-Capillary: 311 mg/dL — ABNORMAL HIGH (ref 70–99)
Glucose-Capillary: 392 mg/dL — ABNORMAL HIGH (ref 70–99)
Glucose-Capillary: 328 mg/dL — ABNORMAL HIGH (ref 70–99)

## 2023-06-26 LAB — AEROBIC CULTURE W GRAM STAIN (SUPERFICIAL SPECIMEN)

## 2023-06-26 LAB — MAGNESIUM: Magnesium: 1.8 mg/dL (ref 1.7–2.4)

## 2023-06-26 SURGERY — IRRIGATION AND DEBRIDEMENT WOUND
Anesthesia: General | Laterality: Right

## 2023-06-26 MED ORDER — PROPOFOL 10 MG/ML IV BOLUS
INTRAVENOUS | Status: DC | PRN
  Administered 2023-06-26: 100 mg via INTRAVENOUS

## 2023-06-26 MED ORDER — LIDOCAINE 2% (20 MG/ML) 5 ML SYRINGE
INTRAMUSCULAR | Status: DC | PRN
  Administered 2023-06-26: 20 mg via INTRAVENOUS

## 2023-06-26 MED ORDER — FLUCONAZOLE 150 MG PO TABS
150.0000 mg | ORAL_TABLET | Freq: Once | ORAL | Status: AC
  Administered 2023-06-26: 150 mg via ORAL
  Filled 2023-06-26: qty 1

## 2023-06-26 MED ORDER — MIDAZOLAM HCL 2 MG/2ML IJ SOLN
INTRAMUSCULAR | Status: AC
  Filled 2023-06-26: qty 2

## 2023-06-26 MED ORDER — LACTATED RINGERS IV SOLN: INTRAVENOUS | Status: DC

## 2023-06-26 MED ORDER — ONDANSETRON HCL 4 MG/2ML IJ SOLN
INTRAMUSCULAR | Status: DC | PRN
  Administered 2023-06-26: 4 mg via INTRAVENOUS

## 2023-06-26 MED ORDER — DEXAMETHASONE SODIUM PHOSPHATE 10 MG/ML IJ SOLN
INTRAMUSCULAR | Status: AC
  Filled 2023-06-26: qty 1

## 2023-06-26 MED ORDER — INSULIN ASPART 100 UNIT/ML IJ SOLN
0.0000 [IU] | INTRAMUSCULAR | Status: DC | PRN
  Administered 2023-06-26: 10 [IU] via SUBCUTANEOUS

## 2023-06-26 MED ORDER — PROPOFOL 10 MG/ML IV BOLUS
INTRAVENOUS | Status: AC
  Filled 2023-06-26: qty 20

## 2023-06-26 MED ORDER — INSULIN GLARGINE-YFGN 100 UNIT/ML ~~LOC~~ SOLN
15.0000 [IU] | Freq: Every day | SUBCUTANEOUS | Status: DC
  Administered 2023-06-26: 15 [IU] via SUBCUTANEOUS
  Filled 2023-06-26 (×2): qty 0.15

## 2023-06-26 MED ORDER — GLYCOPYRROLATE PF 0.2 MG/ML IJ SOSY
PREFILLED_SYRINGE | INTRAMUSCULAR | Status: AC
  Filled 2023-06-26: qty 1

## 2023-06-26 MED ORDER — PHENYLEPHRINE 80 MCG/ML (10ML) SYRINGE FOR IV PUSH (FOR BLOOD PRESSURE SUPPORT)
PREFILLED_SYRINGE | INTRAVENOUS | Status: DC | PRN
  Administered 2023-06-26: 160 ug via INTRAVENOUS

## 2023-06-26 MED ORDER — INSULIN GLARGINE-YFGN 100 UNIT/ML ~~LOC~~ SOLN
10.0000 [IU] | Freq: Every day | SUBCUTANEOUS | Status: DC
  Filled 2023-06-26: qty 0.1

## 2023-06-26 MED ORDER — CHLORHEXIDINE GLUCONATE 0.12 % MT SOLN
OROMUCOSAL | Status: AC
  Administered 2023-06-26: 15 mL via OROMUCOSAL
  Filled 2023-06-26: qty 15

## 2023-06-26 MED ORDER — MIDAZOLAM HCL 2 MG/2ML IJ SOLN
INTRAMUSCULAR | Status: DC | PRN
  Administered 2023-06-26 (×2): 1 mg via INTRAVENOUS

## 2023-06-26 MED ORDER — OXYCODONE HCL 5 MG PO TABS: 5.0000 mg | ORAL_TABLET | Freq: Once | ORAL | Status: DC | PRN

## 2023-06-26 MED ORDER — PROMETHAZINE HCL 25 MG/ML IJ SOLN: 6.2500 mg | INTRAMUSCULAR | Status: DC | PRN

## 2023-06-26 MED ORDER — FENTANYL CITRATE (PF) 250 MCG/5ML IJ SOLN
INTRAMUSCULAR | Status: AC
  Filled 2023-06-26: qty 5

## 2023-06-26 MED ORDER — SUGAMMADEX SODIUM 200 MG/2ML IV SOLN
INTRAVENOUS | Status: DC | PRN
  Administered 2023-06-26: 150 mg via INTRAVENOUS

## 2023-06-26 MED ORDER — ROCURONIUM BROMIDE 10 MG/ML (PF) SYRINGE
PREFILLED_SYRINGE | INTRAVENOUS | Status: DC | PRN
  Administered 2023-06-26: 50 mg via INTRAVENOUS

## 2023-06-26 MED ORDER — GLYCOPYRROLATE 0.2 MG/ML IJ SOLN
INTRAMUSCULAR | Status: DC | PRN
  Administered 2023-06-26: .2 mg via INTRAVENOUS

## 2023-06-26 MED ORDER — MEPERIDINE HCL 25 MG/ML IJ SOLN: 6.2500 mg | INTRAMUSCULAR | Status: DC | PRN

## 2023-06-26 MED ORDER — ROCURONIUM BROMIDE 10 MG/ML (PF) SYRINGE
PREFILLED_SYRINGE | INTRAVENOUS | Status: AC
  Filled 2023-06-26: qty 10

## 2023-06-26 MED ORDER — HYDROMORPHONE HCL 1 MG/ML IJ SOLN: 0.2500 mg | INTRAMUSCULAR | Status: DC | PRN

## 2023-06-26 MED ORDER — LIDOCAINE 2% (20 MG/ML) 5 ML SYRINGE
INTRAMUSCULAR | Status: AC
  Filled 2023-06-26: qty 5

## 2023-06-26 MED ORDER — DEXAMETHASONE SODIUM PHOSPHATE 10 MG/ML IJ SOLN
INTRAMUSCULAR | Status: DC | PRN
  Administered 2023-06-26: 4 mg via INTRAVENOUS

## 2023-06-26 MED ORDER — LACTATED RINGERS IV SOLN: INTRAVENOUS | Status: DC | PRN

## 2023-06-26 MED ORDER — ORAL CARE MOUTH RINSE: 15.0000 mL | Freq: Once | OROMUCOSAL | Status: AC

## 2023-06-26 MED ORDER — CHLORHEXIDINE GLUCONATE 0.12 % MT SOLN: 15.0000 mL | Freq: Once | OROMUCOSAL | Status: AC

## 2023-06-26 MED ORDER — ONDANSETRON HCL 4 MG/2ML IJ SOLN
INTRAMUSCULAR | Status: AC
  Filled 2023-06-26: qty 2

## 2023-06-26 MED ORDER — MIDAZOLAM HCL 2 MG/2ML IJ SOLN: 0.5000 mg | Freq: Once | INTRAMUSCULAR | Status: DC | PRN

## 2023-06-26 MED ORDER — FENTANYL CITRATE (PF) 250 MCG/5ML IJ SOLN
INTRAMUSCULAR | Status: DC | PRN
  Administered 2023-06-26 (×5): 50 ug via INTRAVENOUS

## 2023-06-26 MED ORDER — OXYCODONE HCL 5 MG/5ML PO SOLN: 5.0000 mg | Freq: Once | ORAL | Status: DC | PRN

## 2023-06-26 SURGICAL SUPPLY — 28 items
BAG COUNTER SPONGE SURGICOUNT (BAG) ×1 IMPLANT
BAG SPNG CNTER NS LX DISP (BAG) ×1
BNDG GAUZE DERMACEA FLUFF 4 (GAUZE/BANDAGES/DRESSINGS) IMPLANT
BNDG GZE DERMACEA 4 6PLY (GAUZE/BANDAGES/DRESSINGS)
CANISTER SUCT 3000ML PPV (MISCELLANEOUS) ×1 IMPLANT
COVER SURGICAL LIGHT HANDLE (MISCELLANEOUS) ×1 IMPLANT
DRAPE IMP U-DRAPE 54X76 (DRAPES) IMPLANT
DRAPE LAPAROSCOPIC ABDOMINAL (DRAPES) IMPLANT
DRAPE LAPAROTOMY 100X72 PEDS (DRAPES) IMPLANT
ELECT CAUTERY BLADE 6.4 (BLADE) ×1 IMPLANT
ELECT REM PT RETURN 9FT ADLT (ELECTROSURGICAL) ×1
ELECTRODE REM PT RTRN 9FT ADLT (ELECTROSURGICAL) ×1 IMPLANT
GAUZE PAD ABD 7.5X8 STRL (GAUZE/BANDAGES/DRESSINGS) IMPLANT
GAUZE PAD ABD 8X10 STRL (GAUZE/BANDAGES/DRESSINGS) IMPLANT
GAUZE SPONGE 4X4 12PLY STRL (GAUZE/BANDAGES/DRESSINGS) IMPLANT
GLOVE BIO SURGEON STRL SZ7 (GLOVE) ×1 IMPLANT
GLOVE BIOGEL PI IND STRL 7.5 (GLOVE) ×1 IMPLANT
GOWN STRL REUS W/ TWL LRG LVL3 (GOWN DISPOSABLE) ×2 IMPLANT
GOWN STRL REUS W/TWL LRG LVL3 (GOWN DISPOSABLE) ×2
KIT BASIN OR (CUSTOM PROCEDURE TRAY) ×1 IMPLANT
KIT TURNOVER KIT B (KITS) ×1 IMPLANT
NS IRRIG 1000ML POUR BTL (IV SOLUTION) ×1 IMPLANT
PACK GENERAL/GYN (CUSTOM PROCEDURE TRAY) ×1 IMPLANT
PACKING GAUZE IODOFORM 1INX5YD (GAUZE/BANDAGES/DRESSINGS) IMPLANT
PAD ARMBOARD 7.5X6 YLW CONV (MISCELLANEOUS) ×1 IMPLANT
PENCIL SMOKE EVACUATOR (MISCELLANEOUS) ×1 IMPLANT
TOWEL GREEN STERILE (TOWEL DISPOSABLE) ×1 IMPLANT
TOWEL GREEN STERILE FF (TOWEL DISPOSABLE) ×1 IMPLANT

## 2023-06-26 NOTE — Assessment & Plan Note (Addendum)
Fasting CBG 322. Received 10 LAI, 33 SAI in the last 24 hours.  - Increase glargine to 15 units to aid in better glycemic control, goal CBG <180 for wound healing purposes - Continue checking CBG at each mealtime and before bed

## 2023-06-26 NOTE — Progress Notes (Signed)
Day of Surgery   Subjective/Chief Complaint: unchanged   Objective: Vital signs in last 24 hours: Temp:  [98.1 F (36.7 C)-99 F (37.2 C)] 98.4 F (36.9 C) (08/24 0628) Pulse Rate:  [92-102] 92 (08/24 0628) Resp:  [17-18] 17 (08/24 0628) BP: (106-121)/(75-83) 106/76 (08/24 0628) SpO2:  [98 %-100 %] 98 % (08/24 0628) Last BM Date : 06/23/23  Intake/Output from previous day: 08/23 0701 - 08/24 0700 In: 950 [P.O.:480; IV Piggyback:470] Out: -  Intake/Output this shift: Total I/O In: 470 [IV Piggyback:470] Out: -   Right flank abscess with chronic skin changes draining purulence   Lab Results:  Recent Labs    06/25/23 0515 06/26/23 0330  WBC 13.3* 9.7  HGB 13.1 13.5  HCT 39.1 41.1  PLT 343 370   BMET Recent Labs    06/24/23 0807 06/25/23 0515  NA 135 134*  K 3.4* 3.8  CL 99 101  CO2 24 23  GLUCOSE 283* 317*  BUN <5* 5*  CREATININE 0.61 0.52  CALCIUM 8.7* 8.7*   PT/INR No results for input(s): "LABPROT", "INR" in the last 72 hours. ABG No results for input(s): "PHART", "HCO3" in the last 72 hours.  Invalid input(s): "PCO2", "PO2"  Studies/Results: US ABDOMEN LIMITED RUQ (LIVER/GB)  Result Date: 06/24/2023 CLINICAL DATA:  Abdominal pain EXAM: ULTRASOUND ABDOMEN LIMITED RIGHT UPPER QUADRANT COMPARISON:  CT 06/23/2023 FINDINGS: Gallbladder: No gallstones or wall thickening visualized. No sonographic Murphy sign noted by sonographer. Gallbladder is distended. Common bile duct: Diameter: 3 mm Liver: No focal lesion identified. Within normal limits in parenchymal echogenicity. Portal vein is patent on color Doppler imaging with normal direction of blood flow towards the liver. Other: None. IMPRESSION: No gallstones or ductal dilatation Electronically Signed   By: Karen Kays M.D.   On: 06/24/2023 12:47    Anti-infectives: Anti-infectives (From admission, onward)    Start     Dose/Rate Route Frequency Ordered Stop   06/24/23 1000  [MAR Hold]  vancomycin  (VANCOREADY) IVPB 750 mg/150 mL        (MAR Hold since Sat 06/26/2023 at 0619.Hold Reason: Transfer to a Procedural area)   750 mg 150 mL/hr over 60 Minutes Intravenous Every 12 hours 06/24/23 0145     06/23/23 2315  vancomycin (VANCOCIN) IVPB 1000 mg/200 mL premix        1,000 mg 200 mL/hr over 60 Minutes Intravenous  Once 06/23/23 2306 06/24/23 0020       Assessment/Plan: To OR this am for drainage of right back abscess  Emelia Loron 06/26/2023

## 2023-06-26 NOTE — Progress Notes (Signed)
Patient ID: Yolanda Hendricks, female   DOB: 1971-06-07, 52 y.o.   MRN: [id number] Primary team messaged as I think with operative findings (I have seen 8/22 culture when wound wasn't really opened) it is better to broaden abx as this wasn't classic mrsa abscess.  Did not receive response yet and abx remain same for now

## 2023-06-26 NOTE — Assessment & Plan Note (Signed)
CBGs poorly controlled in the last 24 hours and ranging in the 300s. Received 45 SAI and 15 LAI.  Will increase her semaglutide to 25 units from 15. - Increase glargine to 25 units to aid in better glycemic control, goal CBG <180 for wound healing purposes - Continue checking CBG at each mealtime and before bed

## 2023-06-26 NOTE — Progress Notes (Addendum)
Daily Progress Note Intern Pager: [phone]  Patient name: Yolanda Hendricks Medical record number: [id number] Date of birth: [birth year] Age: 52 y.o. Gender: female  Primary Care Provider: Penne Lash, MD Consultants: General Surgery Code Status: Full   Pt Overview and Major Events to Date:  8/21: Patient admitted, started on IV Vanc 8/24: Surgical incision and drainage performed   Assessment and Plan: Yolanda Hendricks is a 52 y.o. female presenting with back pain and swelling for approximately the past week after suspected bug bite. Receiving treatment for cellulitis currently with antibiotics and surgical I&D.  Her diabetes remains poorly controlled as well. Past medical history consists of MDD, T2DM, and homelessness. Currently in stable condition. Assessment & Plan Cellulitis of back Surgery was consulted and elected to drain the wound today (8/24). They obtained wound culture. Blood cultures were unremarkable and wound culture revealed abundant staph aureus. Leukocytosis resolved.  - Continue Vancomycin per pharmacy - Consider broadening antibiotic coverage per surgical recommendations and follow up wound culture - Ibuprofen 600 mg q6h and Tylenol 650 mg q6h scheduled, Oxycodone 5 mg q4h PRN for pain Type II diabetes mellitus (HCC) Fasting CBG 322. Received 10 LAI, 33 SAI in the last 24 hours.  - Increase glargine to 15 units to aid in better glycemic control, goal CBG <180 for wound healing purposes - Continue checking CBG at each mealtime and before bed  FEN/GI: carb modified PPx: None - [place] of 1 Dispo: Pending antibiotic management and discussion regarding previous homelessness status and safety. PT eval ordered, appreciate recommendations as well.   Subjective:  Patient was resting comfortably in chair. She reports that the surgery went well this morning, and rates her current pain as 3/10, which was much improved from before the surgery. She was awaiting breakfast  and reported improved abdominal pain and endorsed an appetite. Pt stated that she had a bowel movement.   Objective: Temp:  [98.1 F (36.7 C)-99 F (37.2 C)] 98.4 F (36.9 C) (08/24 0628) Pulse Rate:  [92-102] 92 (08/24 0628) Resp:  [17-18] 17 (08/24 0628) BP: (106-121)/(75-83) 106/76 (08/24 0628) SpO2:  [98 %-100 %] 98 % (08/24 1478)  Physical Exam: General: Alert and Oriented, no acute distressed Cardiovascular: Regular rate and rhythm. No murmurs, rubs, or gallops. Normal S1/ S2 Respiratory: Lungs clear to auscultation bilaterally. No wheezing, rales, or rhonchi present  Abdomen/back: No abdominal tenderness reported, normoactive bowel sounds, no bruising or erythema present; bandage overlying back wound Extremities: No edema present  Laboratory: Most recent CBC Lab Results  Component Value Date   WBC 9.7 06/26/2023   HGB 13.5 06/26/2023   HCT 41.1 06/26/2023   MCV 91.9 06/26/2023   PLT 370 06/26/2023   Most recent BMP    Latest Ref Rng & Units 06/25/2023    5:15 AM  BMP  Glucose 70 - 99 mg/dL 295   BUN 6 - 20 mg/dL 5   Creatinine 6.21 - 3.08 mg/dL 6.57   Sodium 846 - 962 mmol/L 134   Potassium 3.5 - 5.1 mmol/L 3.8   Chloride 98 - 111 mmol/L 101   CO2 22 - 32 mmol/L 23   Calcium 8.9 - 10.3 mg/dL 8.7    CBG (last 3)  Recent Labs    06/26/23 0814 06/26/23 0917 06/26/23 1138  GLUCAP 285* 248* 311*     Dayle Points, Medical Student 06/26/2023, 7:02 AM  [facility] FPTS Intern pager: [phone], text pages welcome Secure chat group [facility]  Hospital Teaching Service    I was personally present and performed or re-performed the history, physical exam and medical decision making activities of this service and have verified that the service and findings are accurately documented in the student's note.  Shelby Mattocks, DO                  06/26/2023, 1:05 PM

## 2023-06-26 NOTE — Plan of Care (Signed)
  Problem: Education: Goal: Knowledge of General Education information will improve Description: Including pain rating scale, medication(s)/side effects and non-pharmacologic comfort measures Outcome: Progressing   Problem: Clinical Measurements: Goal: Ability to maintain clinical measurements within normal limits will improve Outcome: Progressing Goal: Diagnostic test results will improve Outcome: Progressing Goal: Respiratory complications will improve Outcome: Progressing Goal: Cardiovascular complication will be avoided Outcome: Progressing   Problem: Activity: Goal: Risk for activity intolerance will decrease Outcome: Progressing   Problem: Nutrition: Goal: Adequate nutrition will be maintained Outcome: Progressing   

## 2023-06-26 NOTE — Anesthesia Postprocedure Evaluation (Signed)
Anesthesia Post Note  Patient: Murray Hodgkins Roger  Procedure(s) Performed: IRRIGATION AND DEBRIDEMENT BACK ABSCESS (Right)     Patient location during evaluation: PACU Anesthesia Type: General Level of consciousness: awake and alert, patient cooperative and oriented Pain management: pain level controlled Vital Signs Assessment: post-procedure vital signs reviewed and stable Respiratory status: spontaneous breathing, nonlabored ventilation and respiratory function stable Cardiovascular status: blood pressure returned to baseline and stable Postop Assessment: no apparent nausea or vomiting Anesthetic complications: no   No notable events documented.  Last Vitals:  Vitals:   06/26/23 0830 06/26/23 0845  BP: 100/71 117/80  Pulse: 99 (!) 101  Resp: 13 14  Temp:    SpO2: 100% 99%    Last Pain:  Vitals:   06/26/23 0628  TempSrc: Oral  PainSc: 0-No pain                 Zayven Powe,E. Aliyanah Rozas

## 2023-06-26 NOTE — Assessment & Plan Note (Signed)
Patient is I&D postop day 1.  Patient has remained afebrile and currently on IV antibiotics.  General surgery recommend broadening antibiotics,'s I&D culture still pending. -GEN surge following, appreciate recs - Continue Vancomycin per pharmacy - Ibuprofen 600 mg q6h and Tylenol 650 mg q6h scheduled - Oxycodone 5 mg q4h PRN

## 2023-06-26 NOTE — Transfer of Care (Signed)
Immediate Anesthesia Transfer of Care Note  Patient: Yolanda Hendricks  Procedure(s) Performed: IRRIGATION AND DEBRIDEMENT BACK ABSCESS  Patient Location: PACU  Anesthesia Type:General  Level of Consciousness: drowsy and patient cooperative  Airway & Oxygen Therapy: Patient Spontanous Breathing and Patient connected to nasal cannula oxygen  Post-op Assessment: Report given to RN and Post -op Vital signs reviewed and stable  Post vital signs: Reviewed and stable  Last Vitals:  Vitals Value Taken Time  BP 121/85 06/26/23 0815  Temp 36.3 C 06/26/23 0815  Pulse 92 06/26/23 0821  Resp 21 06/26/23 0821  SpO2 99 % 06/26/23 0821  Vitals shown include unfiled device data.  Last Pain:  Vitals:   06/26/23 0628  TempSrc: Oral  PainSc: 0-No pain         Complications: No notable events documented.

## 2023-06-26 NOTE — Progress Notes (Signed)
     Daily Progress Note Intern Pager: [phone]  Patient name: Yolanda Hendricks Medical record number: [id number] Date of birth: 1971-10-20 Age: 52 y.o. Gender: female  Primary Care Provider: Penne Lash, MD Consultants: General Surgery Code Status: Full  Pt Overview and Major Events to Date:  8/21-patient admitted, started on IV bank 8/24-GEN surg performed I&D  Assessment and Plan: Armahni Vincenti is a 52 year old female presenting with back pain and swelling consistent with back abscess and is s/p I&D with GEN surg. Pertinent PMH/PSH includes MDD, T2DM, and homelessness.   Assessment & Plan Cellulitis of back Patient is I&D postop day 1.  Patient has remained afebrile and currently on IV antibiotics.  General surgery recommend broadening antibiotics,'s I&D culture still pending. -GEN surge following, appreciate recs - Continue Vancomycin per pharmacy - Ibuprofen 600 mg q6h and Tylenol 650 mg q6h scheduled - Oxycodone 5 mg q4h PRN Type II diabetes mellitus (HCC) CBGs poorly controlled in the last 24 hours and ranging in the 300s. Received 45 SAI and 15 LAI.  Will increase her semaglutide to 25 units from 15. - Increase glargine to 25 units to aid in better glycemic control, goal CBG <180 for wound healing purposes - Continue checking CBG at each mealtime and before bed     FEN/GI: Carb modified PPx: None Dispo:pending clinical improvement . Barriers include ***.   Subjective:  ***  Objective: Temp:  [97.3 F (36.3 C)-98.4 F (36.9 C)] 98 F (36.7 C) (08/24 1952) Pulse Rate:  [92-112] 93 (08/24 1952) Resp:  [13-18] 18 (08/24 1952) BP: (94-121)/(64-87) 94/64 (08/24 1952) SpO2:  [97 %-100 %] 99 % (08/24 1952) Physical Exam: General: *** Cardiovascular: *** Respiratory: *** Abdomen: *** Extremities: ***  Laboratory: Most recent CBC Lab Results  Component Value Date   WBC 9.7 06/26/2023   HGB 13.5 06/26/2023   HCT 41.1 06/26/2023   MCV 91.9 06/26/2023    PLT 370 06/26/2023   Most recent BMP    Latest Ref Rng & Units 06/25/2023    5:15 AM  BMP  Glucose 70 - 99 mg/dL 147   BUN 6 - 20 mg/dL 5   Creatinine 8.29 - 5.62 mg/dL 1.30   Sodium 865 - 784 mmol/L 134   Potassium 3.5 - 5.1 mmol/L 3.8   Chloride 98 - 111 mmol/L 101   CO2 22 - 32 mmol/L 23   Calcium 8.9 - 10.3 mg/dL 8.7     Other pertinent labs ***   Imaging/Diagnostic Tests: Radiologist Impression: *** My interpretation: Jerre Simon, MD 06/26/2023, 10:03 PM  PGY-***, [facility] FPTS Intern pager: [phone], text pages welcome Secure chat group [facility] Teaching Service

## 2023-06-26 NOTE — Assessment & Plan Note (Addendum)
Surgery was consulted and elected to drain the wound today (8/24). They obtained wound culture. Blood cultures were unremarkable and wound culture revealed abundant staph aureus. Leukocytosis resolved.  - Continue Vancomycin per pharmacy - Consider broadening antibiotic coverage per surgical recommendations and follow up wound culture - Ibuprofen 600 mg q6h and Tylenol 650 mg q6h scheduled, Oxycodone 5 mg q4h PRN for pain

## 2023-06-26 NOTE — Op Note (Signed)
Preoperative diagnosis: Chronic back abscess Postoperative diagnosis: Same as above Procedure: Incision and drainage of back abscess Surgeon: Dr. Harden Mo Anesthesia: General Complications: None Drains: None Estimated blood loss: Minimal Specimens: Cultures to microbiology Sponge needle count was correct Disposition recovery stable condition  Indications: This a 52 year old female with a chronic left back abscess.  We discussed proceeding with incision and drainage in the operating room.  Procedure: After informed consent was obtained she was taken to the operating room.  She was already on antibiotics.  She had SCDs in place.  She was placed under general anesthesia without complication.  She was rolled into the left lateral position with a beanbag and appropriately padded.  She was then prepped and draped in the standard sterile surgical fashion.  A surgical timeout was then performed.  I made an elliptical incision that encompassed the several draining pinholes that she already had.  Once I done this I excised this paddle of skin and drained a fairly large abscess with a significant amount of purulence.  I cultured the purulence.  I then irrigated the wound copiously.  I then packed this with iodoform gauze.  A dressing was placed.  She tolerated this well and was transferred to recovery stable.

## 2023-06-26 NOTE — Anesthesia Procedure Notes (Signed)
Procedure Name: Intubation Date/Time: 06/26/2023 7:36 AM  Performed by: Chelsea Aus, CRNAPre-anesthesia Checklist: Patient identified, Emergency Drugs available, Suction available and Patient being monitored Patient Re-evaluated:Patient Re-evaluated prior to induction Oxygen Delivery Method: Circle system utilized Preoxygenation: Pre-oxygenation with 100% oxygen Induction Type: IV induction Ventilation: Mask ventilation without difficulty Laryngoscope Size: Mac and 3 Grade View: Grade I Tube type: Oral Tube size: 7.0 mm Number of attempts: 1 Placement Confirmation: ETT inserted through vocal cords under direct vision, positive ETCO2 and breath sounds checked- equal and bilateral Secured at: 21 cm Tube secured with: Tape Dental Injury: Teeth and Oropharynx as per pre-operative assessment

## 2023-06-27 ENCOUNTER — Encounter (HOSPITAL_COMMUNITY): Payer: Self-pay | Admitting: General Surgery

## 2023-06-27 DIAGNOSIS — L03312 Cellulitis of back [any part except buttock]: Secondary | ICD-10-CM | POA: Diagnosis not present

## 2023-06-27 DIAGNOSIS — E119 Type 2 diabetes mellitus without complications: Secondary | ICD-10-CM | POA: Diagnosis not present

## 2023-06-27 LAB — GLUCOSE, CAPILLARY
Glucose-Capillary: 253 mg/dL — ABNORMAL HIGH (ref 70–99)
Glucose-Capillary: 145 mg/dL — ABNORMAL HIGH (ref 70–99)
Glucose-Capillary: 188 mg/dL — ABNORMAL HIGH (ref 70–99)
Glucose-Capillary: 165 mg/dL — ABNORMAL HIGH (ref 70–99)

## 2023-06-27 LAB — CREATININE, SERUM
Creatinine, Ser: 0.6 mg/dL (ref 0.44–1.00)
GFR, Estimated: >60 mL/min (ref >60)

## 2023-06-27 MED ORDER — GABAPENTIN 300 MG PO CAPS
300.0000 mg | ORAL_CAPSULE | Freq: Two times a day (BID) | ORAL | Status: DC
  Administered 2023-06-27 – 2023-06-28 (×2): 300 mg via ORAL
  Filled 2023-06-27 (×2): qty 1

## 2023-06-27 MED ORDER — INSULIN GLARGINE-YFGN 100 UNIT/ML ~~LOC~~ SOLN
25.0000 [IU] | Freq: Every day | SUBCUTANEOUS | Status: DC
  Administered 2023-06-27 – 2023-06-28 (×2): 25 [IU] via SUBCUTANEOUS
  Filled 2023-06-27 (×2): qty 0.25

## 2023-06-27 MED ORDER — ACETAMINOPHEN 500 MG PO TABS
1000.0000 mg | ORAL_TABLET | Freq: Four times a day (QID) | ORAL | Status: DC
  Administered 2023-06-27 – 2023-06-28 (×5): 1000 mg via ORAL
  Filled 2023-06-27 (×4): qty 2

## 2023-06-27 NOTE — Plan of Care (Signed)
  Problem: Education: Goal: Knowledge of General Education information will improve Description: Including pain rating scale, medication(s)/side effects and non-pharmacologic comfort measures Outcome: Progressing   Problem: Health Behavior/Discharge Planning: Goal: Ability to manage health-related needs will improve Outcome: Progressing   Problem: Clinical Measurements: Goal: Ability to maintain clinical measurements within normal limits will improve Outcome: Progressing Goal: Will remain free from infection Outcome: Progressing Goal: Respiratory complications will improve Outcome: Progressing Goal: Cardiovascular complication will be avoided Outcome: Progressing   Problem: Activity: Goal: Risk for activity intolerance will decrease Outcome: Progressing   Problem: Nutrition: Goal: Adequate nutrition will be maintained Outcome: Progressing   Problem: Coping: Goal: Level of anxiety will decrease Outcome: Progressing

## 2023-06-27 NOTE — Progress Notes (Signed)
06/27/2023  Yolanda Hendricks [id number] 1970/12/19  CARE TEAM: PCP: Penne Lash, MD  Outpatient Care Team: Patient Care Team: Penne Lash, MD as PCP - General (Family Medicine)  Inpatient Treatment Team: Treatment Team:  Caro Laroche, DO [facility], Md, MD Agee, Louie Casa, [facility] Clydia Llano, RN Stamps, Quincy Carnes, LPN Roselind Messier, RN Helene Kelp, [place]   Problem List:   Principal Problem:   Cellulitis of back Active Problems:   Type II diabetes mellitus (HCC)   06/26/2023  Preoperative diagnosis: Chronic back abscess Postoperative diagnosis: Same as above Procedure: Incision and drainage of back abscess Surgeon: Dr. Harden Mo    Assessment [facility] Stay = 3 days) 1 Day Post-Op    Stabilizing    Plan:  -Pack wound.  I would do twice daily to help clean up faster and most likely transition to daily at discharge.  Husband at bedside and seems comfortable with learning this.  -IV antibiotic coverage.  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 Dwain Sarna.  I agree standard of care is to have coverage for anaerobes and atypicals given the diabetes.  Therefore would do Vanco/Zosyn x 5 days and regroup.  Can transition to oral antibiotic if it is ready to discharge tomorrow.  We will see.  -Aggressive diabetic control.  Agree with transitioning to insulin given poorly controlled diabetes with A1c 12.9%.  -VTE prophylaxis- SCDs, etc  -mobilize as tolerated to help recovery  -Disposition: Per primary service.  Suspect she can be discharged next few days once diabetic under control with regimen and packing talk.       I reviewed nursing notes, hospitalist notes, last 24 h vitals and pain scores, last 48 h intake and output, last 24 h labs and trends, and last 24 h imaging results.  I have reviewed this patient's available data, including medical history, events of note, test results, etc as part of my  evaluation.   A significant portion of that time was spent in counseling. Care during the described time interval was provided by me.  This care required straight-forward level of medical decision making.  06/27/2023    Subjective: (Chief complaint)  Pain is less.  Using occasional oxycodone and Tylenol.  Husband in room.  Objective:  Vital signs:  Vitals:   06/26/23 1952 06/27/23 0113 06/27/23 0601 06/27/23 0720  BP: 94/64 106/83 118/87 113/84  Pulse: 93 72 81 91  Resp: 18 16 16 18   Temp: 98 F (36.7 C) 98.1 F (36.7 C) 98.8 F (37.1 C) 98.2 F (36.8 C)  TempSrc:    Oral  SpO2: 99% 100% 100% 98%  Weight:      Height:        Last BM Date : 06/26/23  Intake/Output   Yesterday:  08/24 0701 - 08/25 0700 In: 960 [P.O.:360; I.V.:600] Out: 25 [Blood:25] This shift:  No intake/output data recorded.  Bowel function:  Flatus: YES  BM:  No  Drain: (No drain)   Physical Exam:  General: Pt awake/alert in no acute distress Eyes: PERRL, normal EOM.  Sclera clear.  No icterus Neuro: CN II-XII intact w/o focal sensory/motor deficits. Lymph: No head/neck/groin lymphadenopathy Psych:  No delerium/psychosis/paranoia.  Oriented x 4 HENT: Normocephalic, Mucus membranes moist.  No thrush Neck: Supple, No tracheal deviation.  No obvious thyromegaly Chest: No pain to chest wall compression.  Good respiratory excursion.  No audible wheezing CV:  Pulses intact.  Regular rhythm.  No major extremity  edema MS: Normal AROM mjr joints.  No obvious deformity Abdomen: Soft.  Nondistended.  No evidence of peritonitis.  No incarcerated hernias. Ext:   No deformity.  No mjr edema.  No cyanosis  Skin: In right lateral back I removed packing.  Some tissue necrosis but no active purulence.  No active bleeding.  Hemostasis good.    Repacked.  Some ecchymosis right lateral side and edema/cellulitis but appears improved overall.  No petechiae / purpurea.  No other  sores.  Warm and  dry    Results:   Cultures: Recent Results (from the past 720 hour(s))  Culture, blood (routine x 2)     Status: None (Preliminary result)   Collection Time: 06/23/23  9:19 PM   Specimen: BLOOD  Result Value Ref Range Status   Specimen Description BLOOD LEFT ANTECUBITAL  Final   Special Requests   Final    BOTTLES DRAWN AEROBIC AND ANAEROBIC Blood Culture adequate volume   Culture   Final    NO GROWTH 4 DAYS Performed at [facility], [address] [place], [place] [address]    Report Status PENDING  Incomplete  Culture, blood (routine x 2)     Status: None (Preliminary result)   Collection Time: 06/23/23 11:03 PM   Specimen: BLOOD LEFT ARM  Result Value Ref Range Status   Specimen Description BLOOD LEFT ARM  Final   Special Requests   Final    BOTTLES DRAWN AEROBIC AND ANAEROBIC Blood Culture adequate volume   Culture   Final    NO GROWTH 4 DAYS Performed at [facility], [address], [place], [place] [address]    Report Status PENDING  Incomplete  Aerobic Culture w Gram Stain (superficial specimen)     Status: None   Collection Time: 06/24/23 12:36 AM   Specimen: Back  Result Value Ref Range Status   Specimen Description BACK  Final   Special Requests NONE  Final   Gram Stain   Final    RARE WBC PRESENT, PREDOMINANTLY PMN FEW GRAM POSITIVE COCCI Performed at [facility], [address] [place], [place] [address]    Culture   Final    ABUNDANT METHICILLIN RESISTANT STAPHYLOCOCCUS AUREUS   Report Status 06/26/2023 FINAL  Final   Organism ID, Bacteria METHICILLIN RESISTANT STAPHYLOCOCCUS AUREUS  Final      Susceptibility   Methicillin resistant staphylococcus aureus - MIC*    CIPROFLOXACIN >=8 RESISTANT Resistant     ERYTHROMYCIN >=8 RESISTANT Resistant     GENTAMICIN <=0.5 SENSITIVE Sensitive     OXACILLIN >=4 RESISTANT Resistant     TETRACYCLINE <=1 SENSITIVE Sensitive     VANCOMYCIN <=0.5 SENSITIVE Sensitive     TRIMETH/SULFA >=320  RESISTANT Resistant     CLINDAMYCIN <=0.25 SENSITIVE Sensitive     RIFAMPIN <=0.5 SENSITIVE Sensitive     Inducible Clindamycin NEGATIVE Sensitive     LINEZOLID 2 SENSITIVE Sensitive     * ABUNDANT METHICILLIN RESISTANT STAPHYLOCOCCUS AUREUS  Aerobic/Anaerobic Culture w Gram Stain (surgical/deep wound)     Status: None (Preliminary result)   Collection Time: 06/26/23  7:57 AM   Specimen: Back; Abscess  Result Value Ref Range Status   Specimen Description ABSCESS  Final   Special Requests right back  Final   Gram Stain   Final    FEW WBC SEEN MODERATE GRAM POSITIVE COCCI Performed at [facility], [address], [place], [place] [address]    Culture  PENDING  Incomplete   Report Status PENDING  Incomplete    Labs: Results for orders placed or performed during the hospital encounter of 06/23/23 (from the past 48 hour(s))  Glucose, capillary     Status: Abnormal   Collection Time: 06/25/23 11:43 AM  Result Value Ref Range   Glucose-Capillary 276 (H) 70 - 99 mg/dL    Comment: Glucose reference range applies only to samples taken after fasting for at least 8 hours.  Glucose, capillary     Status: Abnormal   Collection Time: 06/25/23  4:46 PM  Result Value Ref Range   Glucose-Capillary 277 (H) 70 - 99 mg/dL    Comment: Glucose reference range applies only to samples taken after fasting for at least 8 hours.  Glucose, capillary     Status: Abnormal   Collection Time: 06/25/23  7:34 PM  Result Value Ref Range   Glucose-Capillary 308 (H) 70 - 99 mg/dL    Comment: Glucose reference range applies only to samples taken after fasting for at least 8 hours.  Magnesium     Status: None   Collection Time: 06/26/23  3:30 AM  Result Value Ref Range   Magnesium 1.8 1.7 - 2.4 mg/dL    Comment: Performed at [facility], [address] [place], [place] [address]  CBC     Status: None   Collection Time: 06/26/23  3:30 AM  Result Value Ref Range   WBC 9.7 4.0 - 10.5 K/uL   RBC  4.47 3.87 - 5.11 MIL/uL   Hemoglobin 13.5 12.0 - 15.0 g/dL   HCT 32.4 40.1 - 02.7 %   MCV 91.9 80.0 - 100.0 fL   MCH 30.2 26.0 - 34.0 pg   MCHC 32.8 30.0 - 36.0 g/dL   RDW 25.3 66.4 - 40.3 %   Platelets 370 150 - 400 K/uL   nRBC 0.0 0.0 - 0.2 %    Comment: Performed at [facility], [address] [place], [place] [address]  Glucose, capillary     Status: Abnormal   Collection Time: 06/26/23  6:29 AM  Result Value Ref Range   Glucose-Capillary 322 (H) 70 - 99 mg/dL    Comment: Glucose reference range applies only to samples taken after fasting for at least 8 hours.  Aerobic/Anaerobic Culture w Gram Stain (surgical/deep wound)     Status: None (Preliminary result)   Collection Time: 06/26/23  7:57 AM   Specimen: Back; Abscess  Result Value Ref Range   Specimen Description ABSCESS    Special Requests right back    Gram Stain      FEW WBC SEEN MODERATE GRAM POSITIVE COCCI Performed at [facility], [address], [place], [place] [address]    Culture PENDING    Report Status PENDING   Glucose, capillary     Status: Abnormal   Collection Time: 06/26/23  8:14 AM  Result Value Ref Range   Glucose-Capillary 285 (H) 70 - 99 mg/dL    Comment: Glucose reference range applies only to samples taken after fasting for at least 8 hours.  Glucose, capillary     Status: Abnormal   Collection Time: 06/26/23  9:17 AM  Result Value Ref Range   Glucose-Capillary 248 (H) 70 - 99 mg/dL    Comment: Glucose reference range applies only to samples taken after fasting for at least 8 hours.  Glucose, capillary     Status: Abnormal   Collection Time: 06/26/23 11:38 AM  Result Value Ref Range   Glucose-Capillary 311 (H) 70 - 99 mg/dL    Comment: Glucose reference range applies only to samples taken after fasting for at least 8 hours.  Glucose, capillary     Status: Abnormal   Collection Time: 06/26/23  3:38 PM  Result Value Ref Range   Glucose-Capillary 392 (H) 70 - 99 mg/dL     Comment: Glucose reference range applies only to samples taken after fasting for at least 8 hours.  Glucose, capillary     Status: Abnormal   Collection Time: 06/26/23  7:53 PM  Result Value Ref Range   Glucose-Capillary 328 (H) 70 - 99 mg/dL    Comment: Glucose reference range applies only to samples taken after fasting for at least 8 hours.   Comment 1 Notify RN   Glucose, capillary     Status: Abnormal   Collection Time: 06/27/23  7:17 AM  Result Value Ref Range   Glucose-Capillary 253 (H) 70 - 99 mg/dL    Comment: Glucose reference range applies only to samples taken after fasting for at least 8 hours.    Imaging / Studies: No results found.  Medications / Allergies: per chart  Antibiotics: Anti-infectives (From admission, onward)    Start     Dose/Rate Route Frequency Ordered Stop   06/26/23 1415  fluconazole (DIFLUCAN) tablet 150 mg        150 mg Oral  Once 06/26/23 1329 06/26/23 1409   06/24/23 1000  vancomycin (VANCOREADY) IVPB 750 mg/150 mL        750 mg 150 mL/hr over 60 Minutes Intravenous Every 12 hours 06/24/23 0145     06/23/23 2315  vancomycin (VANCOCIN) IVPB 1000 mg/200 mL premix        1,000 mg 200 mL/hr over 60 Minutes Intravenous  Once 06/23/23 2306 06/24/23 0020         Note: Portions of this report may have been transcribed using voice recognition software. Every effort was made to ensure accuracy; however, inadvertent computerized transcription errors may be present.   Any transcriptional errors that result from this process are unintentional.    Ardeth Sportsman, MD, FACS, MASCRS Esophageal, Gastrointestinal & Colorectal Surgery Robotic and Minimally Invasive Surgery  [facility] [address], [address] [place], [place] [address] [phone] Fax [phone] Main  CONTACT INFORMATION: Weekday (9AM-5PM): Call [facility] main office at [phone] Weeknight (5PM-9AM) or Weekend/Holiday: Check  EPIC "Web Links" tab & use "AMION" (password " [email or web address]") for General Surgery [facility] coverage  Please, DO NOT use SecureChat  (it is not reliable communication to reach operating surgeons & will lead to a delay in care).   Epic staff messaging available for outptient concerns needing 1-2 business day response.      06/27/2023  9:52 AM

## 2023-06-27 NOTE — Progress Notes (Signed)
Dressing to middle back moderate saturated sanguineous drainage, this nurse replace dressing without removing original packing of wound. Pt tolerated well

## 2023-06-27 NOTE — Progress Notes (Signed)
Dressing change to surgical site lower back, packed wet to dry covered with mepilex due to skin irritation from tape

## 2023-06-27 NOTE — Progress Notes (Signed)
Pt. Express not have a stable home, "but has a place to go once discharged". Pt. Ask for assistance with medication at discharge.

## 2023-06-27 NOTE — Progress Notes (Signed)
Pharmacy Antibiotic Note  Yolanda Hendricks is a 52 y.o. female admitted on 06/23/2023 with cellulitis/myositis, left back abscess status post I&D with surgery 8/24. Pharmacy has been consulted for vancomycin dosing.  Last Scr 0.52 on 5/23. Today is day 5 of therapy.   Plan: Continue Vancomycin 750 mg IV q12h, eAUC 483 Obtain SCr today Vanc levels as indicated Follow renal function, clinical response, cultures, and de-escalation  Height: 5\' 7"  (170.2 cm) Weight: 59 kg (130 lb) IBW/kg (Calculated) : 61.6  Temp (24hrs), Avg:98.3 F (36.8 C), Min:98 F (36.7 C), Max:98.8 F (37.1 C)  Recent Labs  Lab 06/23/23 2108 06/23/23 2127 06/24/23 0807 06/25/23 0515 06/26/23 0330  WBC 16.2*  --  16.6* 13.3* 9.7  CREATININE 0.62  --  0.61 0.52  --   LATICACIDVEN  --  1.0  --   --   --     Estimated Creatinine Clearance: 79.2 mL/min (by C-G formula based on SCr of 0.52 mg/dL).    No Known Allergies  Antimicrobials this admission: Vancomycin 8/21 >>   Microbiology results: 8/22 Back cx: abundant MRSA; S - Tetracycline, Clindamycin, Linezolid, Vanc 8/24 Back abscess: Moderate gram positive cocci   Thank you for allowing pharmacy to be a part of this patient's care.  Lora Paula, PharmD PGY-2 Infectious Diseases Pharmacy Resident 06/27/2023 9:34 AM

## 2023-06-28 ENCOUNTER — Other Ambulatory Visit (HOSPITAL_COMMUNITY): Payer: Self-pay

## 2023-06-28 DIAGNOSIS — L03312 Cellulitis of back [any part except buttock]: Secondary | ICD-10-CM | POA: Diagnosis not present

## 2023-06-28 LAB — CULTURE, BLOOD (ROUTINE X 2)
Culture: NO GROWTH
Culture: NO GROWTH
Special Requests: ADEQUATE
Special Requests: ADEQUATE

## 2023-06-28 LAB — GLUCOSE, CAPILLARY
Glucose-Capillary: 274 mg/dL — ABNORMAL HIGH (ref 70–99)
Glucose-Capillary: 95 mg/dL (ref 70–99)
Glucose-Capillary: 227 mg/dL — ABNORMAL HIGH (ref 70–99)

## 2023-06-28 LAB — MRSA NEXT GEN BY PCR, NASAL: MRSA by PCR Next Gen: DETECTED — AB

## 2023-06-28 MED ORDER — ACCU-CHEK SOFTCLIX LANCETS MISC
0 refills | Status: DC
  Filled 2023-06-28: qty 100, 30d supply, fill #0

## 2023-06-28 MED ORDER — LANCET DEVICE MISC
1.0000 | Freq: Three times a day (TID) | 0 refills | Status: AC
  Filled 2023-06-28: qty 1, 30d supply, fill #0

## 2023-06-28 MED ORDER — DOXYCYCLINE HYCLATE 100 MG PO TABS
100.0000 mg | ORAL_TABLET | Freq: Two times a day (BID) | ORAL | 0 refills | Status: AC
  Filled 2023-06-28: qty 18, 9d supply, fill #0

## 2023-06-28 MED ORDER — DOXYCYCLINE HYCLATE 100 MG PO TABS
100.0000 mg | ORAL_TABLET | Freq: Two times a day (BID) | ORAL | Status: DC
  Administered 2023-06-28: 100 mg via ORAL
  Filled 2023-06-28: qty 1

## 2023-06-28 MED ORDER — BLOOD GLUCOSE MONITOR SYSTEM W/DEVICE KIT
1.0000 | PACK | Freq: Three times a day (TID) | 0 refills | Status: DC
  Filled 2023-06-28: qty 1, 30d supply, fill #0

## 2023-06-28 MED ORDER — INSULIN GLARGINE 100 UNIT/ML SOLOSTAR PEN
20.0000 [IU] | PEN_INJECTOR | Freq: Every day | SUBCUTANEOUS | 0 refills | Status: DC
  Filled 2023-06-28: qty 6, 30d supply, fill #0
  Filled 2024-01-26: qty 6, 30d supply, fill #1
  Filled 2024-03-29: qty 6, 30d supply, fill #0

## 2023-06-28 MED ORDER — IBUPROFEN 600 MG PO TABS: 600.0000 mg | ORAL_TABLET | Freq: Four times a day (QID) | ORAL | Status: DC

## 2023-06-28 MED ORDER — BLOOD GLUCOSE TEST VI STRP
1.0000 | ORAL_STRIP | Freq: Three times a day (TID) | 0 refills | Status: AC
Start: 2023-06-28 — End: 2023-08-01
  Filled 2023-06-28: qty 100, 34d supply, fill #0

## 2023-06-28 MED ORDER — METFORMIN HCL 500 MG PO TABS
500.0000 mg | ORAL_TABLET | Freq: Two times a day (BID) | ORAL | 0 refills | Status: DC
  Filled 2023-06-28: qty 60, 30d supply, fill #0

## 2023-06-28 MED ORDER — GABAPENTIN 300 MG PO CAPS
300.0000 mg | ORAL_CAPSULE | Freq: Two times a day (BID) | ORAL | 0 refills | Status: DC
  Filled 2023-06-28: qty 60, 30d supply, fill #0

## 2023-06-28 MED ORDER — ACCU-CHEK SOFTCLIX LANCET DEV KIT
1.0000 | PACK | Freq: Three times a day (TID) | 0 refills | Status: AC
  Filled 2023-06-28: qty 1, 30d supply, fill #0

## 2023-06-28 MED ORDER — INSULIN PEN NEEDLE 32G X 4 MM MISC
0 refills | Status: DC
  Filled 2023-06-28: qty 100, 30d supply, fill #0

## 2023-06-28 NOTE — Discharge Planning (Signed)
Patient alert and oriented. IV access removed. Dressing change teaching provided to patient and patient's friend. Patient transported to lobby for private transportation home.

## 2023-06-28 NOTE — Progress Notes (Signed)
   06/28/23 1037  Mobility  Activity Ambulated with assistance in room  Level of Assistance Independent after set-up  Assistive Device None  Distance Ambulated (ft) 60 ft  Activity Response Tolerated well  Mobility Referral Yes  $Mobility charge 1 Mobility  Mobility Specialist Start Time (ACUTE ONLY) 1015  Mobility Specialist Stop Time (ACUTE ONLY) 1032  Mobility Specialist Time Calculation (min) (ACUTE ONLY) 17 min   Mobility Specialist: Progress Note  Pt agreeable to mobility session - received in bed. Ambulated independently throughout session with no complaints. Pt returned to chair sitting by window per request with all needs met. Pt aware of call bell location.   Barnie Mort, BS Mobility Specialist Please contact via SecureChat or Rehab office at [phone].

## 2023-06-28 NOTE — Discharge Instructions (Addendum)
Dear Yolanda Hendricks,   Thank you so much for allowing [place] to be part of your care!  You were admitted to [facility] for cellulitis (a soft tissue infection) of your back. You were treated with surgery and IV antibiotics. Please continue doxycycline for another 9 days.   You were also started on insulin during this admission for uncontrolled diabetes. This is a big reason your infection became so severe. It is very important you continue this medication when you leave the hospital. You should take lantus 20 units daily. Please also continue metformin 500 units twice daily.   We made an appointment to follow up the hospitalization and establish care at our clinic as below.   POST-HOSPITAL & CARE INSTRUCTIONS Check blood sugar 4 times a day.  Please let PCP/Specialists know of any changes that were made.  Please see medications section of this packet for any medication changes.   DOCTOR'S APPOINTMENT & FOLLOW UP CARE INSTRUCTIONS  Future Appointments  Date Time Provider Department Center  07/01/2023  1:30 PM Bess Kinds, MD [facility]    RETURN PRECAUTIONS: Fevers, worsening wound with drainage, weakness, lightheadedness  Take care and be well!  Family Medicine Teaching Service  [place]  [facility]  [address] [place], [place] [address] [phone]    Wet to Dry WOUND CARE: - Change dressing twice daily - Supplies: sterile saline, kerlex, scissors, ABD pads, tape  Remove dressing and all packing carefully, moistening with sterile saline as needed to avoid packing/internal dressing sticking to the wound. 2.   Clean edges of skin around the wound with water/gauze, making sure there is no tape debris or leakage left on skin that could cause skin irritation or breakdown. 3.   Dampen and clean kerlex with sterile saline and pack wound from wound base to skin level, making sure to take note of any possible areas of wound tracking, tunneling and  packing appropriately. Wound can be packed loosely. Trim kerlex to size if a whole kerlex is not required. 4.   Cover wound with a dry ABD pad and secure with tape.  5.   Write the date/time on the dry dressing/tape to better track when the last dressing change occurred. - apply any skin protectant/powder if recommended by clinician to protect skin/skin folds. - change dressing as needed if leakage occurs, wound gets contaminated, or patient requests to shower. - You may shower daily with wound open and following the shower the wound should be dried and a clean dressing placed.  - Medical grade tape as well as packing supplies can be found at [employer] on [address] or [employer] on [address]. The remaining supplies can be found at your local drug store, [employer] etc.

## 2023-06-28 NOTE — Progress Notes (Signed)
Patient ID: Fredric Dine, female   DOB: May 27, 1971, 52 y.o.   MRN: [id number] [facility] Surgery Progress Note  2 Days Post-Op  Subjective: CC-  Tolerating dressing changes well. Prior to admission she was staying with a friend. She will be able to stay with him again after discharge. He has seen but not attempted dressing change.  Objective: Vital signs in last 24 hours: Temp:  [98.1 F (36.7 C)-98.3 F (36.8 C)] 98.1 F (36.7 C) (08/26 0745) Pulse Rate:  [69-84] 77 (08/26 0745) Resp:  [18-20] 18 (08/26 0745) BP: (117-123)/(75-97) 120/97 (08/26 0745) SpO2:  [99 %-100 %] 100 % (08/26 0745) Last BM Date : 06/27/23  Intake/Output from previous day: 08/25 0701 - 08/26 0700 In: 600 [P.O.:600] Out: -  Intake/Output this shift: No intake/output data recorded.  PE: Gen:  Alert, NAD, pleasant Back: wound pictured below. Some fibrinous tissue but no purulent drainage. Cellulitis improving   Lab Results:  Recent Labs    06/26/23 0330  WBC 9.7  HGB 13.5  HCT 41.1  PLT 370   BMET Recent Labs    06/27/23 1011  CREATININE 0.60   PT/INR No results for input(s): "LABPROT", "INR" in the last 72 hours. CMP     Component Value Date/Time   NA 134 (L) 06/25/2023 0515   K 3.8 06/25/2023 0515   CL 101 06/25/2023 0515   CO2 23 06/25/2023 0515   GLUCOSE 317 (H) 06/25/2023 0515   BUN 5 (L) 06/25/2023 0515   CREATININE 0.60 06/27/2023 1011   CALCIUM 8.7 (L) 06/25/2023 0515   PROT 6.3 (L) 06/25/2023 0515   ALBUMIN 2.5 (L) 06/25/2023 0515   AST 16 06/25/2023 0515   ALT 10 06/25/2023 0515   ALKPHOS 103 06/25/2023 0515   BILITOT 0.4 06/25/2023 0515   GFRNONAA >60 06/27/2023 1011   GFRAA >60 06/16/2020 0611   Lipase     Component Value Date/Time   LIPASE 17 06/24/2023 0807       Studies/Results: No results found.  Anti-infectives: Anti-infectives (From admission, onward)    Start     Dose/Rate Route Frequency Ordered Stop   06/28/23 1700  doxycycline  (VIBRA-TABS) tablet 100 mg        100 mg Oral Every 12 hours 06/28/23 1148 07/07/23 2159   06/26/23 1415  fluconazole (DIFLUCAN) tablet 150 mg        150 mg Oral  Once 06/26/23 1329 06/26/23 1409   06/24/23 1000  vancomycin (VANCOREADY) IVPB 750 mg/150 mL  Status:  Discontinued        750 mg 150 mL/hr over 60 Minutes Intravenous Every 12 hours 06/24/23 0145 06/28/23 1148   06/23/23 2315  vancomycin (VANCOCIN) IVPB 1000 mg/200 mL premix        1,000 mg 200 mL/hr over 60 Minutes Intravenous  Once 06/23/23 2306 06/24/23 0020        Assessment/Plan POD#2 s/p Incision and drainage of back abscess  8/24 Dr. Dwain Sarna - culture MRSA, on vancomycin - Continue BID dressing changes. Will ask RN to teach patient's friend how to perform these next time he is here. Once comfortable with wound care, ok for discharge from surgical standpoint. I will place follow up on AVS - continue antibiotics, ok to transition to oral when ready for discharge. Recommend 5 total days of abx from surgery - DM management per primary  ID - vancomycin FEN - CM diet VTE - ok for chemical dvt ppx from surgical standpoint Foley - none  LOS: 4 days    Franne Forts, [facility] Surgery 06/28/2023, 11:56 AM Please see Amion for pager number during day hours 7:00am-4:30pm

## 2023-06-28 NOTE — Inpatient Diabetes Management (Addendum)
Inpatient Diabetes Program Recommendations  AACE/ADA: New Consensus Statement on Inpatient Glycemic Control (2015)  Target Ranges:  Prepandial:   less than 140 mg/dL      Peak postprandial:   less than 180 mg/dL (1-2 hours)      Critically ill patients:  140 - 180 mg/dL   Lab Results  Component Value Date   GLUCAP 95 06/28/2023   HGBA1C 12.9 (H) 06/24/2023    Latest Reference Range & Units 06/26/23 11:38 06/26/23 15:38 06/26/23 19:53 06/27/23 07:17 06/27/23 11:26 06/27/23 16:11 06/27/23 19:23 06/28/23 07:41 06/28/23 11:19  Glucose-Capillary 70 - 99 mg/dL 962 (H) Novolog 15 units 392 (H) Novolog 20 units 328 (H) 253 (H) Novolog 11 units 145 (H) Novolog 3 units 188 (H) Novolog 4 units 165 (H) 274 (H) Novolog 11 units 95  (H): Data is abnormally high  Review of Glycemic Control  Diabetes history: type 2 Outpatient Diabetes medications: Metformin 500 mg BID Current orders for Inpatient glycemic control: Semglee 10 units daily, Novolog 0-20 units correction scale TID  Inpatient Diabetes Program Recommendations:   Patient received 18 units Novolog correction over the past 24 hrs. Please consider: -Add Novolog 3 units tid meal coverage if eats 50% meals. -Decrease Novolog correction to 0-9 units tid, 0-5 units hs  Thank you, Darel Hong E. Cayden Rautio, RN, MSN, CDE  Diabetes Coordinator Inpatient Glycemic Control Team Team Pager [phone] (8am-5pm) 06/28/2023 11:42 AM

## 2023-06-28 NOTE — Assessment & Plan Note (Deleted)
Patient is I&D postop day 1.  Patient has remained afebrile and currently on IV antibiotics.  General surgery recommend broadening antibiotics,'s I&D culture still pending. -GEN surge following, appreciate recs, - Wound care: pack wound BID daily - Started vanc (8/22-) per pharmacy, will transition to doxycycline***  - Ibuprofen 600 mg q6h and Tylenol 650 mg q6h scheduled - Oxycodone 5 mg q4h PRN, required x2 yesterday

## 2023-06-28 NOTE — Discharge Summary (Addendum)
Family Medicine Teaching [facility] Discharge Summary  Patient name: Yolanda Hendricks Medical record number: [id number] Date of birth: December 25, 1970 Age: 52 y.o. Gender: female Date of Admission: 06/23/2023  Date of Discharge: 06/28/2023 Admitting Physician: Shelby Mattocks, DO  Primary Care Provider: Penne Lash, MD Consultants: Surgery   Indication for Hospitalization:  Cellulitis   Brief Hospital Course:  Yolanda Hendricks is a 52 y.o.female with a history of T2DM, asthma who was admitted to the [facility] Medicine Teaching Service at [facility] for back pain/swelling 2/2 draining wound. Her hospital course is detailed below:  Cellulitis Initially presented with back pain and swelling with a draining wound after suspected bug bite and attempted self-lancing by patient's friend. CT imaging was suggestive of cellulitis, and she was started on Vancomycin in the ED. Leukocytosis improved with IV antibiotic treatment.. Blood cx unremarkable after 2 days. Surgery was consulted and performed I&D on 8/24. She remained afebrile and hemodynamically stable throughout her admission. Wound cx grew abundant Staph Aureus. Transitioned antibiotics from IV vancomycin to doxycycline based on susceptibilities. She was advised to continue BID dressing changes.   T2DM Poorly controlled diabetes with HbA1c of 12.9 on admission. Pt on home metformin 500 BID, though mostly nonadherent s/t social stressors (homelessness). Diabetes managed inpatient with insulin. Insulin regimen was titrated with final discharge regimen being Glargine 20 units basal and she was restarted on metformin 500 mg BID.    Other chronic conditions were medically managed with home medications and formulary alternatives as necessary (MDD)  MDD: PROZAC 20 mg once daily  PCP Follow-up Recommendations: Started basal insulin inpatient. Did not send short acting due to insulin naivete. Consider adding jardiance and/or glp-1 for additional mortality  benefit and glucose control.  Recheck BMP and Magnesium at follow-up appointment to follow up hypomagnesemia.   Discharge Diagnoses/Problem List:  Principal Problem:   Cellulitis of back Active Problems:   Type II diabetes mellitus (HCC)   Disposition: Home  Discharge Condition: Stable   Discharge Exam:  General: Alert, no acute distress, pleasant  CV: RRR, no murmurs, normal S1/S2 Pulm: CTAB, good WOB on RA, no crackles or wheezing Abd: Soft, no distension, no tenderness Skin: back is currently dressed in guaze, has surrounding erythema Ext: No BLE edema  Significant Procedures: Incision and Drainage   Significant Labs and Imaging:  No results for input(s): "WBC", "HGB", "HCT", "PLT" in the last 48 hours. Recent Labs  Lab 06/27/23 1011  CREATININE 0.60   Results/Tests Pending at Time of Discharge:   Susceptibility data from last 90 days. Collected Specimen Info Organism Ciprofloxacin Clindamycin Erythromycin Gentamicin Susc lslt Inducible Clindamycin Linezolid Oxacillin Rifampin TELAVANCIN TETRACYCLINE Trimethoprim/Sulfa  06/26/23 Abscess Methicillin resistant staphylococcus aureus  R  S  R  S  S  S  R  S  S  S  R  06/24/23 Back Methicillin resistant staphylococcus aureus  R  S  R  S  S  S  R  S  S  S  R   Discharge Medications:  Allergies as of 06/28/2023   No Known Allergies      Medication List     TAKE these medications    Accu-Chek Guide test strip Generic drug: glucose blood Test blood sugar in the morning, at noon, and at bedtime.   Accu-Chek Guide w/Device Kit Test blood sugar in the morning, at noon, and at bedtime.   Accu-Chek Softclix Lancet Dev Kit Test blood sugar in the morning, at noon, and at bedtime.   Accu-Chek  Softclix Lancets lancets Use as instructed up to four times daily   acetaminophen 325 MG tablet Commonly known as: TYLENOL Take 650 mg by mouth every 6 (six) hours as needed for mild pain.   albuterol 108 (90 Base) MCG/ACT  inhaler Commonly known as: VENTOLIN HFA Inhale 1-2 puffs into the lungs every 6 (six) hours as needed for wheezing or shortness of breath.   BD Pen Needle Nano U/F 32G X 4 MM Misc Generic drug: Insulin Pen Needle Use as directed with insulin pens   doxycycline 100 MG tablet Commonly known as: VIBRA-TABS Take 1 tablet (100 mg total) by mouth every 12 (twelve) hours for 9 days.   FLUoxetine 20 MG capsule Commonly known as: PROZAC Take 1 capsule (20 mg total) by mouth daily.   gabapentin 300 MG capsule Commonly known as: NEURONTIN Take 1 capsule (300 mg total) by mouth 2 (two) times daily. Please make appt before next refill What changed: when to take this   ibuprofen 600 MG tablet Commonly known as: ADVIL Take 1 tablet (600 mg total) by mouth every 6 (six) hours.   Lancet Device Misc Test blood sugar in the morning, at noon, and at bedtime.   Lantus SoloStar 100 UNIT/ML Solostar Pen Generic drug: insulin glargine Inject 20 Units into the skin daily.   metFORMIN 500 MG tablet Commonly known as: GLUCOPHAGE Take 1 tablet (500 mg total) by mouth 2 (two) times daily with a meal. Need an appointment before next refill.       Discharge Instructions: Please refer to Patient Instructions section of EMR for full details.  Patient was counseled important signs and symptoms that should prompt return to medical care, changes in medications, dietary instructions, activity restrictions, and follow up appointments.   Follow-Up Appointments:  Follow-up Information     Maczis, Hedda Slade, [place]. Go on 07/20/2023.   Specialty: General Surgery Why: Your appointment is 07/20/23 at 10:30am Arrive early to check in, fill out paperwork, Bring photo ID and insurance information Contact information: [address] [place] [address] [phone]         Penne Lash, MD Follow up.   Specialty: Family Medicine Why: Please go to appointment at the family medicine center  on 8/28. Contact information: [address] [place] [address] [phone]                Scheduled for follow up with Dr. Bess Kinds on 07/01/23  Peterson Ao, MD 06/28/2023, 12:09 PM PGY-1, [facility] Family Medicine  I was personally present and performed medical decision making activities of this service and have verified that the service and findings are accurately documented in the resident's note.  Shelby Mattocks, DO                  06/28/2023, 4:33 PM

## 2023-06-28 NOTE — Assessment & Plan Note (Deleted)
A1c 12.9, CBGs better controlled last 24 hour, after increasing semglee dose, BS rangeed 140s -270s. FSBS 274 this morning. 8/25 LAI 25 units and SAI 17 units overnight. 11 units SAI this AM  - Semglee 25 units to aid in better glycemic control, goal CBG <180 for wound healing purposes - SSI  - Continue CBG TID w/ meals + at bedtime

## 2023-07-01 ENCOUNTER — Inpatient Hospital Stay: Payer: Self-pay | Admitting: Student

## 2023-07-01 LAB — AEROBIC/ANAEROBIC CULTURE W GRAM STAIN (SURGICAL/DEEP WOUND)

## 2023-07-01 NOTE — Progress Notes (Deleted)
  SUBJECTIVE:   CHIEF COMPLAINT / HPI:   Hospital F/u -Pt seen for cellulitis and abscess, received I&D by surgery -Abx IV vanc > Doxy  PERTINENT  PMH / PSH: ***  Past Medical History:  Diagnosis Date   Asthma    Diabetes mellitus without complication (HCC)    MDD (major depressive disorder)     Patient Care Team: Penne Lash, MD as PCP - General (Family Medicine) OBJECTIVE:  LMP 06/23/2019 (Approximate)  Physical Exam   ASSESSMENT/PLAN:  There are no diagnoses linked to this encounter. No follow-ups on file. Bess Kinds, MD 07/01/2023, 12:20 PM PGY-***, [facility] Family Medicine {    This will disappear when note is signed, click to select method of visit    :1}

## 2023-07-02 ENCOUNTER — Inpatient Hospital Stay: Payer: Medicaid Other

## 2023-07-02 NOTE — Progress Notes (Deleted)
    SUBJECTIVE:   CHIEF COMPLAINT / HPI:   Cellulitis Patient recently hospitalized 8/21-8/26 2024 for IV antibiotics to treat cellulitis located on back, had I&D on 8/24.  Wound culture grew staph RES.  She was discharged on doxycycline.  T2DM A1c 12.9 during hospitalization.  Discharged on glargine 20 units daily and metformin 500 mg twice daily  PERTINENT  PMH / PSH: T2DM, asthma  OBJECTIVE:   LMP 06/23/2019 (Approximate)  ***  General: NAD, pleasant, able to participate in exam Cardiac: RRR, no murmurs. Respiratory: CTAB, normal effort, No wheezes, rales or rhonchi Abdomen: Bowel sounds present, nontender, nondistended, no hepatosplenomegaly. Extremities: no edema or cyanosis. Skin: warm and dry, no rashes noted Neuro: alert, no obvious focal deficits Psych: Normal affect and mood  ASSESSMENT/PLAN:   No problem-specific Assessment & Plan notes found for this encounter.     Dr. Erick Alley, DO [facility]    {    This will disappear when note is signed, click to select method of visit    :1}

## 2024-01-26 ENCOUNTER — Other Ambulatory Visit: Payer: Self-pay | Admitting: Family Medicine

## 2024-01-26 ENCOUNTER — Other Ambulatory Visit: Payer: Self-pay

## 2024-01-26 MED ORDER — ACETAMINOPHEN 325 MG PO TABS
650.0000 mg | ORAL_TABLET | Freq: Four times a day (QID) | ORAL | 1 refills | Status: AC | PRN
Start: 2024-01-26 — End: ?
  Filled 2024-01-26 – 2024-03-27 (×2): qty 30, 4d supply, fill #0
  Filled 2024-05-25: qty 30, 4d supply, fill #1

## 2024-01-26 NOTE — Telephone Encounter (Signed)
 Chart reviewed. Rx refilled.

## 2024-02-09 ENCOUNTER — Other Ambulatory Visit: Payer: Self-pay

## 2024-03-09 ENCOUNTER — Encounter (HOSPITAL_COMMUNITY): Payer: Self-pay

## 2024-03-27 ENCOUNTER — Other Ambulatory Visit (HOSPITAL_COMMUNITY): Payer: Self-pay

## 2024-03-28 ENCOUNTER — Other Ambulatory Visit (HOSPITAL_COMMUNITY): Payer: Self-pay

## 2024-03-28 ENCOUNTER — Other Ambulatory Visit: Payer: Self-pay

## 2024-03-29 ENCOUNTER — Other Ambulatory Visit (HOSPITAL_COMMUNITY): Payer: Self-pay

## 2024-03-30 ENCOUNTER — Other Ambulatory Visit (HOSPITAL_COMMUNITY): Payer: Self-pay

## 2024-04-27 ENCOUNTER — Other Ambulatory Visit (HOSPITAL_COMMUNITY): Payer: Self-pay

## 2024-04-27 ENCOUNTER — Telehealth (HOSPITAL_COMMUNITY): Payer: Self-pay

## 2024-04-27 NOTE — Telephone Encounter (Signed)
 Error Encounter    JNL,CMA

## 2024-05-23 ENCOUNTER — Emergency Department (HOSPITAL_COMMUNITY)
Admission: EM | Admit: 2024-05-23 | Discharge: 2024-05-23 | Disposition: A | Discharge status: Home / Self Care | Attending: Emergency Medicine | Admitting: Emergency Medicine

## 2024-05-23 ENCOUNTER — Other Ambulatory Visit: Payer: Self-pay

## 2024-05-23 DIAGNOSIS — Z794 Long term (current) use of insulin: Secondary | ICD-10-CM | POA: Diagnosis not present

## 2024-05-23 DIAGNOSIS — J020 Streptococcal pharyngitis: Secondary | ICD-10-CM | POA: Insufficient documentation

## 2024-05-23 DIAGNOSIS — Z7984 Long term (current) use of oral hypoglycemic drugs: Secondary | ICD-10-CM | POA: Diagnosis not present

## 2024-05-23 DIAGNOSIS — J029 Acute pharyngitis, unspecified: Secondary | ICD-10-CM | POA: Diagnosis present

## 2024-05-23 LAB — RESP PANEL BY RT-PCR (RSV, FLU A&B, COVID)  RVPGX2
Influenza A by PCR: NEGATIVE
Influenza B by PCR: NEGATIVE
Resp Syncytial Virus by PCR: NEGATIVE
SARS Coronavirus 2 by RT PCR: NEGATIVE

## 2024-05-23 LAB — GROUP A STREP BY PCR: Group A Strep by PCR: DETECTED — AB

## 2024-05-23 LAB — CBG MONITORING, ED: Glucose-Capillary: 240 mg/dL — ABNORMAL HIGH (ref 70–99)

## 2024-05-23 MED ORDER — PENICILLIN V POTASSIUM 500 MG PO TABS: 500.0000 mg | ORAL_TABLET | Freq: Four times a day (QID) | ORAL | 0 refills | Status: AC

## 2024-05-23 NOTE — ED Triage Notes (Signed)
 Patient to ED by POV with c/o sore throat x3 days. She states she has not been able to eat anything denies fever or chills, reports nausea. HX of diabetes.

## 2024-05-23 NOTE — Discharge Instructions (Signed)
 Tylenol  and ibuprofen  for pain.  Warm salt water gargles.  Finish antibiotics.  Replace toothbrush.

## 2024-05-23 NOTE — ED Provider Notes (Signed)
 [place] EMERGENCY DEPARTMENT AT [facility] Provider Note   CSN: [id number] Arrival date & time: 05/23/24  0730     Patient presents with: Sore Throat   Yolanda Hendricks is a 53 y.o. female.  She is here with a complaint of sore throat for the last 3 days.  Difficulty swallowing so not really eating much.  She does not think she has had a fever.  No cough or shortness of breath.  No sick contacts.   The history is provided by the patient.  Sore Throat This is a new problem. The current episode started more than 2 days ago. The problem occurs constantly. The problem has not changed since onset.Pertinent negatives include no chest pain, no abdominal pain, no headaches and no shortness of breath. The symptoms are aggravated by swallowing. Nothing relieves the symptoms. She has tried nothing for the symptoms. The treatment provided no relief.       Prior to Admission medications   Medication Sig Start Date End Date Taking? Authorizing Provider  Accu-Chek Softclix Lancets lancets Use as instructed up to four times daily 06/28/23   Lenard Calin, MD  acetaminophen  (TYLENOL ) 325 MG tablet Take 2 tablets (650 mg total) by mouth every 6 (six) hours as needed for mild pain (pain score 1-3). 01/26/24   Diona Perkins, MD  albuterol  (VENTOLIN  HFA) 108 (90 Base) MCG/ACT inhaler Inhale 1-2 puffs into the lungs every 6 (six) hours as needed for wheezing or shortness of breath. 01/24/20   Genette Cadet, MD  Blood Glucose Monitoring Suppl (BLOOD GLUCOSE MONITOR SYSTEM) w/Device KIT Test blood sugar in the morning, at noon, and at bedtime. 06/28/23   Nicholas Bar, MD  FLUoxetine  (PROZAC ) 20 MG capsule Take 1 capsule (20 mg total) by mouth daily. 10/31/21   Dasie Ellouise CROME, FNP  gabapentin  (NEURONTIN ) 300 MG capsule Take 1 capsule (300 mg total) by mouth 2 (two) times daily. Please make appt before next refill 06/28/23   Nicholas Bar, MD  ibuprofen  (ADVIL ) 600 MG tablet Take 1 tablet (600 mg  total) by mouth every 6 (six) hours. 06/28/23   Nicholas Bar, MD  insulin  glargine (LANTUS ) 100 UNIT/ML Solostar Pen Inject 20 Units into the skin daily. 06/28/23   Nicholas Bar, MD  Insulin  Pen Needle 32G X 4 MM MISC Use as directed with insulin  pens 06/28/23   Lenard Calin, MD  metFORMIN  (GLUCOPHAGE ) 500 MG tablet Take 1 tablet (500 mg total) by mouth 2 (two) times daily with a meal. Need an appointment before next refill. 06/28/23   Nicholas Bar, MD    Allergies: Patient has no known allergies.    Review of Systems  Respiratory:  Negative for shortness of breath.   Cardiovascular:  Negative for chest pain.  Gastrointestinal:  Negative for abdominal pain.  Neurological:  Negative for headaches.    Updated Vital Signs BP 129/89 (BP Location: Right Arm)   Pulse (!) 107   Temp 98.7 F (37.1 C) (Oral)   Ht 5' 7.5 (1.715 m)   Wt 61.2 kg   LMP 06/23/2019 (Approximate)   SpO2 98%   BMI 20.83 kg/m   Physical Exam Vitals and nursing note reviewed.  Constitutional:      Appearance: She is well-developed.  HENT:     Head: Normocephalic and atraumatic.     Nose: No congestion or rhinorrhea.     Mouth/Throat:     Pharynx: Posterior oropharyngeal erythema present. No oropharyngeal exudate.     Tonsils: No tonsillar  exudate or tonsillar abscesses. 2+ on the right. 2+ on the left.  Eyes:     Conjunctiva/sclera: Conjunctivae normal.  Cardiovascular:     Rate and Rhythm: Regular rhythm. Tachycardia present.     Heart sounds: Normal heart sounds.  Pulmonary:     Effort: Pulmonary effort is normal.     Breath sounds: Normal breath sounds.  Musculoskeletal:     Cervical back: Neck supple.  Lymphadenopathy:     Cervical: No cervical adenopathy.  Skin:    General: Skin is warm and dry.  Neurological:     General: No focal deficit present.     Mental Status: She is alert.     GCS: GCS eye subscore is 4. GCS verbal subscore is 5. GCS motor subscore is 6.     (all labs ordered  are listed, but only abnormal results are displayed) Labs Reviewed  GROUP A STREP BY PCR - Abnormal; Notable for the following components:      Result Value   Group A Strep by PCR DETECTED (*)    All other components within normal limits  CBG MONITORING, ED - Abnormal; Notable for the following components:   Glucose-Capillary 240 (*)    All other components within normal limits  RESP PANEL BY RT-PCR (RSV, FLU A&B, COVID)  [id number]    EKG: None  Radiology: No results found.   Procedures   Medications Ordered in the ED - No data to display                                  Medical Decision Making Risk Prescription drug management.   This patient complains of sore throat; this involves an extensive number of treatment Options and is a complaint that carries with it a high risk of complications and morbidity. The differential includes viral syndrome, pharyngitis, contact irritant, strep  I ordered, reviewed and interpreted labs, which included blood sugar elevated, strep positive, COVID flu negative Previous records obtained and reviewed in epic no recent admissions Social determinants considered, multiple barriers including housing transportation food and smoking Critical Interventions: None  After the interventions stated above, I reevaluated the patient and found patient to be tolerating p.o. without any difficulty and hemodynamically stable Admission and further testing considered, no indications for admission at this time.  Will cover with antibiotics and recommended close follow-up with PCP.  Return instructions discussed.      Final diagnoses:  Strep throat    ED Discharge Orders          Ordered    penicillin  v potassium (VEETID) 500 MG tablet  4 times daily        05/23/24 0845               Towana Ozell BROCKS, MD 05/23/24 1743

## 2024-05-26 ENCOUNTER — Other Ambulatory Visit: Payer: Self-pay

## 2024-05-26 ENCOUNTER — Other Ambulatory Visit (HOSPITAL_COMMUNITY): Payer: Self-pay

## 2024-06-16 ENCOUNTER — Other Ambulatory Visit (HOSPITAL_COMMUNITY): Payer: Self-pay

## 2024-07-21 ENCOUNTER — Other Ambulatory Visit: Payer: Self-pay | Admitting: Family Medicine

## 2024-07-21 ENCOUNTER — Other Ambulatory Visit: Payer: Self-pay | Admitting: Medical Genetics

## 2024-07-24 ENCOUNTER — Other Ambulatory Visit (HOSPITAL_COMMUNITY): Payer: Self-pay

## 2024-08-04 ENCOUNTER — Telehealth: Payer: Self-pay | Admitting: Dietician

## 2024-08-04 NOTE — Telephone Encounter (Signed)
 Returned patient call as she left a message on our voicemail stating that she needed to establish care with a diabetes doctor to obtain a referral for a diabetes eye exam, insulin , other medications, and a check-up.  Discussed that she will need a referral from her primary doctor for our office and she should obtain the referral for an ophthalmologist from her PCP as well as medications that she currently needs due to wait time here.  Once she has the referral to [facility], she can obtain an appointment.  Patient verbalized understanding.  Leita Constable, RD, LDN, CDCES, DipACLM

## 2024-08-24 ENCOUNTER — Other Ambulatory Visit: Payer: Self-pay

## 2024-08-24 DIAGNOSIS — Z1231 Encounter for screening mammogram for malignant neoplasm of breast: Secondary | ICD-10-CM

## 2024-09-11 ENCOUNTER — Ambulatory Visit

## 2024-09-12 ENCOUNTER — Ambulatory Visit

## 2024-09-12 DIAGNOSIS — Z1231 Encounter for screening mammogram for malignant neoplasm of breast: Secondary | ICD-10-CM

## 2024-09-14 ENCOUNTER — Encounter (INDEPENDENT_AMBULATORY_CARE_PROVIDER_SITE_OTHER): Payer: Self-pay

## 2024-09-22 ENCOUNTER — Other Ambulatory Visit: Payer: Self-pay

## 2024-09-22 DIAGNOSIS — Z1231 Encounter for screening mammogram for malignant neoplasm of breast: Secondary | ICD-10-CM

## 2024-10-06 ENCOUNTER — Ambulatory Visit

## 2024-10-06 DIAGNOSIS — Z1231 Encounter for screening mammogram for malignant neoplasm of breast: Secondary | ICD-10-CM

## 2024-10-09 ENCOUNTER — Ambulatory Visit

## 2024-10-10 ENCOUNTER — Other Ambulatory Visit (HOSPITAL_COMMUNITY)

## 2024-10-12 ENCOUNTER — Ambulatory Visit

## 2024-10-24 ENCOUNTER — Ambulatory Visit

## 2024-11-03 ENCOUNTER — Ambulatory Visit
Admission: RE | Admit: 2024-11-03 | Discharge: 2024-11-03 | Disposition: A | Discharge status: Home / Self Care | Source: Ambulatory Visit

## 2024-11-03 DIAGNOSIS — Z1231 Encounter for screening mammogram for malignant neoplasm of breast: Secondary | ICD-10-CM

## 2024-11-06 ENCOUNTER — Ambulatory Visit

## 2024-11-06 ENCOUNTER — Other Ambulatory Visit: Payer: Self-pay | Admitting: Medical Genetics

## 2024-11-06 DIAGNOSIS — Z006 Encounter for examination for normal comparison and control in clinical research program: Secondary | ICD-10-CM

## 2024-11-09 ENCOUNTER — Encounter

## 2024-11-09 ENCOUNTER — Ambulatory Visit

## 2024-11-13 ENCOUNTER — Ambulatory Visit: Admitting: Nurse Practitioner

## 2024-11-20 ENCOUNTER — Encounter

## 2024-11-22 ENCOUNTER — Encounter

## 2024-11-23 ENCOUNTER — Ambulatory Visit

## 2024-11-24 ENCOUNTER — Ambulatory Visit: Admitting: Nurse Practitioner

## 2024-11-24 ENCOUNTER — Encounter: Payer: Self-pay | Admitting: Nurse Practitioner

## 2024-11-24 VITALS — BP 119/75 | HR 119 | Ht 67.5 in | Wt 134.0 lb

## 2024-11-24 DIAGNOSIS — F419 Anxiety disorder, unspecified: Secondary | ICD-10-CM | POA: Diagnosis not present

## 2024-11-24 DIAGNOSIS — R000 Tachycardia, unspecified: Secondary | ICD-10-CM | POA: Diagnosis not present

## 2024-11-24 DIAGNOSIS — G4700 Insomnia, unspecified: Secondary | ICD-10-CM | POA: Diagnosis not present

## 2024-11-24 DIAGNOSIS — J45909 Unspecified asthma, uncomplicated: Secondary | ICD-10-CM | POA: Diagnosis not present

## 2024-11-24 DIAGNOSIS — G629 Polyneuropathy, unspecified: Secondary | ICD-10-CM | POA: Diagnosis not present

## 2024-11-24 DIAGNOSIS — E1165 Type 2 diabetes mellitus with hyperglycemia: Secondary | ICD-10-CM | POA: Diagnosis not present

## 2024-11-24 DIAGNOSIS — F17200 Nicotine dependence, unspecified, uncomplicated: Secondary | ICD-10-CM

## 2024-11-24 DIAGNOSIS — F332 Major depressive disorder, recurrent severe without psychotic features: Secondary | ICD-10-CM | POA: Diagnosis not present

## 2024-11-24 DIAGNOSIS — F1410 Cocaine abuse, uncomplicated: Secondary | ICD-10-CM

## 2024-11-24 LAB — POCT GLYCOSYLATED HEMOGLOBIN (HGB A1C): Hemoglobin A1C: 14.6 % — AB (ref 4.0–5.6)

## 2024-11-24 MED ORDER — BLOOD GLUCOSE MONITORING SUPPL DEVI: 1.0000 | Freq: Three times a day (TID) | 0 refills | Status: AC

## 2024-11-24 MED ORDER — INSULIN GLARGINE 100 UNIT/ML SOLOSTAR PEN: 10.0000 [IU] | PEN_INJECTOR | Freq: Every day | SUBCUTANEOUS | 2 refills | Status: AC

## 2024-11-24 MED ORDER — LANCET DEVICE MISC: 1.0000 | Freq: Three times a day (TID) | 0 refills | Status: AC

## 2024-11-24 MED ORDER — BLOOD GLUCOSE TEST VI STRP: 1.0000 | ORAL_STRIP | Freq: Three times a day (TID) | 0 refills | Status: AC

## 2024-11-24 MED ORDER — METFORMIN HCL ER 500 MG PO TB24: 500.0000 mg | ORAL_TABLET | Freq: Two times a day (BID) | ORAL | 1 refills | Status: AC

## 2024-11-24 MED ORDER — INSULIN PEN NEEDLE 32G X 4 MM MISC: 0 refills | Status: AC

## 2024-11-24 MED ORDER — ALBUTEROL SULFATE HFA 108 (90 BASE) MCG/ACT IN AERS: 1.0000 | INHALATION_SPRAY | Freq: Four times a day (QID) | RESPIRATORY_TRACT | 0 refills | Status: AC | PRN

## 2024-11-24 MED ORDER — FLUOXETINE HCL 20 MG PO CAPS: 20.0000 mg | ORAL_CAPSULE | Freq: Every day | ORAL | 1 refills | Status: AC

## 2024-11-24 MED ORDER — GABAPENTIN 100 MG PO CAPS: 100.0000 mg | ORAL_CAPSULE | Freq: Every day | ORAL | 1 refills | Status: AC

## 2024-11-24 MED ORDER — FLUOXETINE HCL 20 MG PO CAPS: 20.0000 mg | ORAL_CAPSULE | Freq: Every day | ORAL | 0 refills | Status: DC

## 2024-11-24 NOTE — Assessment & Plan Note (Addendum)
 Patient was restless no complaints of chest pain, shortness of breath EKG obtained shows sinus tachycardia rate of 104 beats per minutes, biatrial enlargement

## 2024-11-24 NOTE — Patient Instructions (Signed)
 Goal for fasting blood sugar ranges from 80 to 120 and 2 hours after any meal or at bedtime should be between 130 to 170.    1. Uncontrolled other specified diabetes mellitus with hyperglycemia (HCC) (Primary) - POCT glycosylated hemoglobin (Hb A1C) - Microalbumin/Creatinine Ratio, Urine - CBC - CMP14+EGFR - metFORMIN  (GLUCOPHAGE -XR) 500 MG 24 hr tablet; Take 1 tablet (500 mg total) by mouth 2 (two) times daily with a meal.  Dispense: 180 tablet; Refill: 1 - insulin  glargine (LANTUS ) 100 UNIT/ML Solostar Pen; Inject 10 Units into the skin daily.  Dispense: 15 mL; Refill: 2  2. Cocaine use disorder (HCC) - Drug Screen 10 W/Conf, Serum

## 2024-11-24 NOTE — Assessment & Plan Note (Addendum)
 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 Neuropathy with toe pain and cloudy vision due to uncontrolled diabetes. No current insulin  use or home monitoring. - Restarted Lantus  at 10 units daily. - Restarted metformin  500 mg twice daily. - Referred to an eye doctor for evaluation of cloudy vision. Glucose daily ordered -Counseled on low-carb diet - Educated on target blood sugar levels: fasting 80-120 mg/dL, postprandial <829 mg/dL. - Advised to report blood sugar levels consistently <70 mg/dL. Not on a statin lipid panel at next visit

## 2024-11-24 NOTE — Assessment & Plan Note (Signed)
" °  Smoking since age 54, currently smokes a pack for about six days. Discussed potential vascular issues. - Screened for peripheral artery disease to assess for vascular blockages.  "

## 2024-11-24 NOTE — Assessment & Plan Note (Signed)
 Cocaine use disorder, active Active cocaine use with last use a week ago. Acknowledged risks and expressed willingness to seek help. - Discussed risks of cocaine use and encouraged cessation. - Offered referral to rehabilitation services for cocaine use disorder.  Will refer patient to [facility]

## 2024-11-24 NOTE — Progress Notes (Signed)
 "  New Patient Office Visit  Subjective:  Patient ID: Yolanda Hendricks, female    DOB: 08-06-1971  Age: 54 y.o. MRN: [id number]  CC:  Chief Complaint  Patient presents with   Establish Care    HPI     Discussed the use of AI scribe software for clinical note transcription with the patient, who gave verbal consent to proceed.  History of Present Illness Yolanda Hendricks is a 54 year old female   has a past medical history of Anxiety, Asthma, Diabetes mellitus without complication (HCC), Hypertension, MDD (major depressive disorder), Neuromuscular disorder (HCC), Seizures (HCC), and Sleep apnea. who presents to establish care.  She has a history of type 2 diabetes, with a lA1c of 14.6. She has not been taking her diabetes medications, including insulin , due to homelessness over the past couple of years. She reports pain in her toes, described as 'cold and tight,' and has difficulty feeling her feet. She also reports cloudy urine and poor vision. She does not have a glucometer or test strips.  She has a history of asthma and uses an albuterol  inhaler, but has not been taking her medications regularly due to her living situation.  She has a history of anxiety and takes Prozac . She experiences difficulty sleeping, sometimes staying awake for days and then sleeping for extended periods, with better sleep during the day. She has not had a sleep study and does not have a CPAP machine despite a history of sleep apnea.  She has a history of cocaine use, with the last use about a week ago. She started smoking at age 53 and currently smokes occasionally, with a pack lasting about six days. She drinks alcohol occasionally but not heavily.  She has two grown children, aged 81 and 2, and currently lives with her father.    Assessment & Plan      Past Medical History:  Diagnosis Date   Anxiety    Asthma    Diabetes mellitus without complication (HCC)    Hypertension    MDD (major depressive  disorder)    Neuromuscular disorder (HCC)    Seizures (HCC)    Sleep apnea     Past Surgical History:  Procedure Laterality Date   EYE SURGERY     INCISION AND DRAINAGE OF WOUND Right 06/26/2023   Procedure: IRRIGATION AND DEBRIDEMENT BACK ABSCESS;  Surgeon: Ebbie Cough, MD;  Location: [facility] OR;  Service: General;  Laterality: Right;   TUBAL LIGATION      Family History  Problem Relation Age of Onset   Diabetes Other    Diabetes Father 81 - 80   Diabetes Paternal Grandmother 59 - 39    Social History   Socioeconomic History   Marital status: Significant Other    Spouse name: Not on file   Number of children: 2   Years of education: Not on file   Highest education level: Some college, no degree  Occupational History   Not on file  Tobacco Use   Smoking status: Some Days    Current packs/day: 0.25    Average packs/day: 0.3 packs/day for 35.0 years (8.8 ttl pk-yrs)    Types: Cigarettes   Smokeless tobacco: Never  Substance and Sexual Activity   Alcohol use: Yes    Alcohol/week: 4.0 standard drinks of alcohol    Types: 4 Cans of beer per week    Comment: occ   Drug use: Not Currently    Types: Cocaine   Sexual activity: Yes  Birth control/protection: Surgical  Other Topics Concern   Not on file  Social History Narrative   Lives with her father    Social Drivers of Health   Tobacco Use: High Risk (11/24/2024)   Patient History    Smoking Tobacco Use: Some Days    Smokeless Tobacco Use: Never    Passive Exposure: Not on file  Financial Resource Strain: High Risk (11/04/2024)   Overall Financial Resource Strain (CARDIA)    Difficulty of Paying Living Expenses: Hard  Food Insecurity: Food Insecurity Present (11/04/2024)   Epic    Worried About Programme Researcher, Broadcasting/film/video in the Last Year: Often true    Ran Out of Food in the Last Year: Often true  Transportation Needs: Unmet Transportation Needs (11/04/2024)   Epic    Lack of Transportation (Medical): Yes    Lack of  Transportation (Non-Medical): Yes  Physical Activity: Insufficiently Active (11/04/2024)   Exercise Vital Sign    Days of Exercise per Week: 2 days    Minutes of Exercise per Session: 30 min  Stress: Stress Concern Present (11/04/2024)   [employer] of Occupational Health - Occupational Stress Questionnaire    Feeling of Stress: Very much  Social Connections: Socially Isolated (11/04/2024)   Social Connection and Isolation Panel    Frequency of Communication with Friends and Family: Once a week    Frequency of Social Gatherings with Friends and Family: Never    Attends Religious Services: More than 4 times per year    Active Member of [employer] or Organizations: No    Attends Engineer, Structural: Not on file    Marital Status: Never married  Intimate Partner Violence: Not At Risk (06/25/2023)   Humiliation, Afraid, Rape, and Kick questionnaire    Fear of Current or Ex-Partner: No    Emotionally Abused: No    Physically Abused: No    Sexually Abused: No  Depression (PHQ2-9): High Risk (11/24/2024)   Depression (PHQ2-9)    PHQ-2 Score: 26  Alcohol Screen: Medium Risk (11/04/2024)   Alcohol Screen    Last Alcohol Screening Score (AUDIT): 14  Housing: High Risk (06/25/2023)   Housing    Last Housing Risk Score: 3  Utilities: Patient Unable To Answer (06/25/2023)   [facility] Utilities    Threatened with loss of utilities: Patient unable to answer  Health Literacy: Not on file    ROS Review of Systems  Constitutional:  Negative for appetite change, chills, fatigue and fever.  HENT:  Negative for congestion, postnasal drip, rhinorrhea and sneezing.   Respiratory:  Negative for cough, shortness of breath and wheezing.   Cardiovascular:  Negative for chest pain, palpitations and leg swelling.  Gastrointestinal:  Negative for abdominal pain, constipation, nausea and vomiting.  Genitourinary:  Negative for difficulty urinating, dysuria, flank pain and frequency.  Musculoskeletal:   Positive for arthralgias. Negative for back pain, joint swelling and myalgias.  Skin:  Negative for color change, pallor, rash and wound.  Neurological:  Positive for numbness. Negative for facial asymmetry, weakness and headaches.  Psychiatric/Behavioral:  Positive for sleep disturbance. Negative for confusion, self-injury and suicidal ideas. The patient is nervous/anxious.     Objective:   Today's Vitals: BP 119/75   Pulse (!) 119   Ht 5' 7.5 (1.715 m)   Wt 134 lb (60.8 kg)   LMP 06/23/2019   SpO2 100%   BMI 20.68 kg/m   Physical Exam Vitals and nursing note reviewed.  Constitutional:  General: She is not in acute distress.    Appearance: Normal appearance. She is not ill-appearing, toxic-appearing or diaphoretic.  Eyes:     General: No scleral icterus.       Right eye: No discharge.        Left eye: No discharge.     Extraocular Movements: Extraocular movements intact.     Conjunctiva/sclera: Conjunctivae normal.  Cardiovascular:     Rate and Rhythm: Regular rhythm. Tachycardia present.     Pulses: Normal pulses.     Heart sounds: Normal heart sounds. No murmur heard.    No friction rub. No gallop.  Pulmonary:     Effort: Pulmonary effort is normal. No respiratory distress.     Breath sounds: Normal breath sounds. No stridor. No wheezing, rhonchi or rales.  Chest:     Chest wall: No tenderness.  Abdominal:     General: There is no distension.     Palpations: Abdomen is soft.     Tenderness: There is no abdominal tenderness. There is no right CVA tenderness, left CVA tenderness or guarding.  Musculoskeletal:        General: No swelling, tenderness, deformity or signs of injury.     Right lower leg: No edema.     Left lower leg: No edema.  Skin:    General: Skin is warm and dry.     Capillary Refill: Capillary refill takes less than 2 seconds.     Coloration: Skin is not jaundiced or pale.     Findings: No bruising, erythema or lesion.  Neurological:      Mental Status: She is alert and oriented to person, place, and time.     Motor: No weakness.     Coordination: Coordination normal.     Gait: Gait normal.  Psychiatric:        Mood and Affect: Mood is anxious.        Thought Content: Thought content normal.        Judgment: Judgment normal.     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 Neuropathy with toe pain and cloudy vision due to uncontrolled diabetes. No current insulin  use or home monitoring. - Restarted Lantus  at 10 units daily. - Restarted metformin  500 mg twice daily. - Referred to an eye doctor for evaluation of cloudy vision. Glucose daily ordered -Counseled on low-carb diet - Educated on target blood sugar levels: fasting 80-120 mg/dL, postprandial <829 mg/dL. - Advised to report blood sugar levels consistently <70 mg/dL. Not on a statin lipid panel at next visit        Relevant Medications   metFORMIN  (GLUCOPHAGE -XR) 500 MG 24 hr tablet   insulin  glargine (LANTUS ) 100 UNIT/ML Solostar Pen   Insulin  Pen Needle 32G X 4 MM MISC   Blood Glucose Monitoring Suppl DEVI   Glucose Blood (BLOOD GLUCOSE TEST STRIPS) STRP   Lancet Device MISC   FLUoxetine  (PROZAC ) 20 MG capsule   Other Relevant Orders   POCT glycosylated hemoglobin (Hb A1C) (Completed)   Microalbumin/Creatinine Ratio, Urine   CBC   CMP14+EGFR   Ambulatory referral to Ophthalmology  Nervous and Auditory   Neuropathy   Restart gabapentin  at 100 mg at bedtime, risk of excessive sedation due to cocaine use disorder      Relevant Medications   gabapentin  (NEURONTIN ) 100 MG capsule     Other   MDD (major  depressive disorder), recurrent severe, without psychosis (HCC)      11/24/2024   11:12 AM 02/05/2020    3:52 PM 06/06/2019    9:53 AM  Depression screen PHQ 2/9  Decreased Interest 3 1 0  Down, Depressed, Hopeless 3 2 1   PHQ - 2 Score 6 3 1   Altered sleeping 3 1 2   Tired, decreased energy 3 0 1  Change in appetite 3 2 1   Feeling bad or failure about yourself  3 1 0  Trouble concentrating 3 0 1  Moving slowly or fidgety/restless 3 0 1  Suicidal thoughts 2 0 0  PHQ-9 Score 26 7  7    Difficult doing work/chores Very difficult Not difficult at all Somewhat difficult     Data saved with a previous flowsheet row definition   Anxiety contributing to elevated heart rate. Previously on Prozac . - Restarted Prozac  20 mg daily for anxiety management. - Prescribed hydroxyzine  as needed for sleep, advised against using Benadryl  concurrently. Advised to seek urgent evaluation at behavioral health care center-contact information provided      Relevant Medications   FLUoxetine  (PROZAC ) 20 MG capsule   Other Relevant Orders   Ambulatory referral to Psychiatry   Insomnia disorder    Chronic insomnia with variable sleep patterns. No prior sleep study conducted. - Sleep study ordered        Anxiety      11/24/2024   11:12 AM  GAD 7 : Generalized Anxiety Score  Nervous, Anxious, on Edge 3  Control/stop worrying 3  Worry too much - different things 2  Trouble relaxing 3  Restless 3  Easily annoyed or irritable 3  Afraid - awful might happen 3  Total GAD 7 Score 20  Anxiety Difficulty Extremely difficult   Generalized anxiety disorder Anxiety contributing to elevated heart rate. Previously on Prozac . - Restarted Prozac  20 mg daily for anxiety management. - Prescribed hydroxyzine  as needed for sleep, advised against using Benadryl  concurrently. Advised to seek urgent evaluation at behavioral health care center-contact information provided       Relevant Medications   FLUoxetine   (PROZAC ) 20 MG capsule   Other Relevant Orders   Ambulatory referral to Psychiatry   Cocaine use disorder (HCC)   Cocaine use disorder, active Active cocaine use with last use a week ago. Acknowledged risks and expressed willingness to seek help. - Discussed risks of cocaine use and encouraged cessation. - Offered referral to rehabilitation services for cocaine use disorder.  Will refer patient to [facility]      Relevant Orders   Drug Screen 10 W/Conf, Serum   Ambulatory referral to Psychiatry   Tobacco use disorder    Smoking since age 13, currently smokes a pack for about six days. Discussed potential vascular issues. - Screened for peripheral artery disease to assess for vascular blockages.       Tachycardia   Patient was restless no complaints of chest pain, shortness of breath EKG obtained shows sinus tachycardia rate of 104 beats per minutes, biatrial enlargement      Other Visit Diagnoses       Asthma, unspecified asthma severity, unspecified whether complicated, unspecified whether persistent       Relevant Medications  albuterol  (VENTOLIN  HFA) 108 (90 Base) MCG/ACT inhaler       Outpatient Encounter Medications as of 11/24/2024  Medication Sig   Blood Glucose Monitoring Suppl DEVI 1 each by Does not apply route in the morning, at noon, and at bedtime. May substitute to any manufacturer covered by patient's insurance.   gabapentin  (NEURONTIN ) 100 MG capsule Take 1 capsule (100 mg total) by mouth at bedtime.   Glucose Blood (BLOOD GLUCOSE TEST STRIPS) STRP 1 each by In Vitro route in the morning, at noon, and at bedtime. May substitute to any manufacturer covered by patient's insurance.   Lancet Device MISC 1 each by Does not apply route in the morning, at noon, and at bedtime. May substitute to any manufacturer covered by patient's insurance.   metFORMIN  (GLUCOPHAGE -XR) 500 MG 24 hr tablet Take 1 tablet (500 mg total) by mouth 2 (two) times daily with a meal.    [DISCONTINUED] Accu-Chek Softclix Lancets lancets Use as instructed up to four times daily   [DISCONTINUED] Blood Glucose Monitoring Suppl (BLOOD GLUCOSE MONITOR SYSTEM) w/Device KIT Test blood sugar in the morning, at noon, and at bedtime.   [DISCONTINUED] Insulin  Pen Needle 32G X 4 MM MISC Use as directed with insulin  pens   acetaminophen  (TYLENOL ) 325 MG tablet Take 2 tablets (650 mg total) by mouth every 6 (six) hours as needed for mild pain (pain score 1-3). (Patient not taking: Reported on 11/24/2024)   albuterol  (VENTOLIN  HFA) 108 (90 Base) MCG/ACT inhaler Inhale 1-2 puffs into the lungs every 6 (six) hours as needed for wheezing or shortness of breath.   FLUoxetine  (PROZAC ) 20 MG capsule Take 1 capsule (20 mg total) by mouth daily.   insulin  glargine (LANTUS ) 100 UNIT/ML Solostar Pen Inject 10 Units into the skin daily.   Insulin  Pen Needle 32G X 4 MM MISC Use as directed with insulin  pens   [DISCONTINUED] albuterol  (VENTOLIN  HFA) 108 (90 Base) MCG/ACT inhaler Inhale 1-2 puffs into the lungs every 6 (six) hours as needed for wheezing or shortness of breath. (Patient not taking: Reported on 11/24/2024)   [DISCONTINUED] FLUoxetine  (PROZAC ) 20 MG capsule Take 1 capsule (20 mg total) by mouth daily. (Patient not taking: Reported on 11/24/2024)   [DISCONTINUED] FLUoxetine  (PROZAC ) 20 MG capsule Take 1 capsule (20 mg total) by mouth daily.   [DISCONTINUED] gabapentin  (NEURONTIN ) 300 MG capsule Take 1 capsule (300 mg total) by mouth 2 (two) times daily. Please make appt before next refill (Patient not taking: Reported on 11/24/2024)   [DISCONTINUED] ibuprofen  (ADVIL ) 600 MG tablet Take 1 tablet (600 mg total) by mouth every 6 (six) hours. (Patient not taking: Reported on 11/24/2024)   [DISCONTINUED] insulin  glargine (LANTUS ) 100 UNIT/ML Solostar Pen Inject 20 Units into the skin daily. (Patient not taking: Reported on 11/24/2024)   [DISCONTINUED] metFORMIN  (GLUCOPHAGE ) 500 MG tablet Take 1 tablet (500 mg  total) by mouth 2 (two) times daily with a meal. Need an appointment before next refill. (Patient not taking: Reported on 11/24/2024)   No facility-administered encounter medications on file as of 11/24/2024.    Follow-up: Return in about 4 weeks (around 12/22/2024) for DM.   Zedrick Springsteen R Donevin Sainsbury, FNP "

## 2024-11-24 NOTE — Assessment & Plan Note (Signed)
" °    11/24/2024   11:12 AM 02/05/2020    3:52 PM 06/06/2019    9:53 AM  Depression screen PHQ 2/9  Decreased Interest 3 1 0  Down, Depressed, Hopeless 3 2 1   PHQ - 2 Score 6 3 1   Altered sleeping 3 1 2   Tired, decreased energy 3 0 1  Change in appetite 3 2 1   Feeling bad or failure about yourself  3 1 0  Trouble concentrating 3 0 1  Moving slowly or fidgety/restless 3 0 1  Suicidal thoughts 2 0 0  PHQ-9 Score 26 7  7    Difficult doing work/chores Very difficult Not difficult at all Somewhat difficult     Data saved with a previous flowsheet row definition   Anxiety contributing to elevated heart rate. Previously on Prozac . - Restarted Prozac  20 mg daily for anxiety management. - Prescribed hydroxyzine  as needed for sleep, advised against using Benadryl  concurrently. Advised to seek urgent evaluation at behavioral health care center-contact information provided "

## 2024-11-24 NOTE — Assessment & Plan Note (Signed)
" °    11/24/2024   11:12 AM  GAD 7 : Generalized Anxiety Score  Nervous, Anxious, on Edge 3  Control/stop worrying 3  Worry too much - different things 2  Trouble relaxing 3  Restless 3  Easily annoyed or irritable 3  Afraid - awful might happen 3  Total GAD 7 Score 20  Anxiety Difficulty Extremely difficult   Generalized anxiety disorder Anxiety contributing to elevated heart rate. Previously on Prozac . - Restarted Prozac  20 mg daily for anxiety management. - Prescribed hydroxyzine  as needed for sleep, advised against using Benadryl  concurrently. Advised to seek urgent evaluation at behavioral health care center-contact information provided  "

## 2024-11-24 NOTE — Assessment & Plan Note (Addendum)
" °  Chronic insomnia with variable sleep patterns. No prior sleep study conducted. - Sleep study ordered   "

## 2024-11-24 NOTE — Assessment & Plan Note (Signed)
 Restart gabapentin  at 100 mg at bedtime, risk of excessive sedation due to cocaine use disorder

## 2024-11-25 LAB — CBC
Hematocrit: 49.8 % — ABNORMAL HIGH (ref 34.0–46.6)
Hemoglobin: 15.7 g/dL (ref 11.1–15.9)
MCH: 30.8 pg (ref 26.6–33.0)
MCHC: 31.5 g/dL (ref 31.5–35.7)
MCV: 98 fL — ABNORMAL HIGH (ref 79–97)
Platelets: 316 10*3/uL (ref 150–450)
RBC: 5.09 x10E6/uL (ref 3.77–5.28)
RDW: 12.3 % (ref 11.7–15.4)
WBC: 6.7 10*3/uL (ref 3.4–10.8)

## 2024-11-25 LAB — CMP14+EGFR
ALT: 6 [IU]/L (ref 0–32)
AST: 15 [IU]/L (ref 0–40)
Albumin: 4.6 g/dL (ref 3.8–4.9)
Alkaline Phosphatase: 83 [IU]/L (ref 49–135)
BUN/Creatinine Ratio: 13 (ref 9–23)
BUN: 11 mg/dL (ref 6–24)
Bilirubin Total: 0.3 mg/dL (ref 0.0–1.2)
CO2: 24 mmol/L (ref 20–29)
Calcium: 10.4 mg/dL — ABNORMAL HIGH (ref 8.7–10.2)
Chloride: 99 mmol/L (ref 96–106)
Creatinine, Ser: 0.84 mg/dL (ref 0.57–1.00)
Globulin, Total: 3.3 g/dL (ref 1.5–4.5)
Glucose: 403 mg/dL — ABNORMAL HIGH (ref 70–99)
Potassium: 4.3 mmol/L (ref 3.5–5.2)
Sodium: 140 mmol/L (ref 134–144)
Total Protein: 7.9 g/dL (ref 6.0–8.5)
eGFR: 84 mL/min/{1.73_m2}

## 2024-11-25 LAB — MICROALBUMIN / CREATININE URINE RATIO
Creatinine, Urine: 43.9 mg/dL
Microalb/Creat Ratio: 8 mg/g{creat} (ref 0–29)
Microalbumin, Urine: 3.6 ug/mL

## 2024-11-27 ENCOUNTER — Ambulatory Visit: Payer: Self-pay | Admitting: Nurse Practitioner

## 2024-11-27 ENCOUNTER — Ambulatory Visit: Admitting: Nurse Practitioner

## 2024-12-01 LAB — DRUG SCREEN 10 W/CONF, SERUM
Amphetamines, IA: NEGATIVE ng/mL
Barbiturates, IA: NEGATIVE ug/mL
Benzodiazepines, IA: NEGATIVE ng/mL
Cocaine & Metabolite, IA: POSITIVE ng/mL — AB
Methadone, IA: NEGATIVE ng/mL
Opiates, IA: NEGATIVE ng/mL
Oxycodones, IA: NEGATIVE ng/mL
Phencyclidine, IA: NEGATIVE ng/mL
Propoxyphene, IA: NEGATIVE ng/mL
THC(Marijuana) Metabolite, IA: NEGATIVE ng/mL

## 2024-12-01 LAB — COCAINE,MS,WB/SP RFX
Benzoylecgonine: 515 ng/mL
Cocaine Confirmation: POSITIVE
Cocaine: NEGATIVE ng/mL

## 2024-12-04 ENCOUNTER — Ambulatory Visit: Payer: Self-pay | Admitting: Podiatry

## 2024-12-05 ENCOUNTER — Ambulatory Visit: Admitting: Podiatry

## 2024-12-05 ENCOUNTER — Other Ambulatory Visit (HOSPITAL_COMMUNITY): Payer: Self-pay

## 2024-12-05 ENCOUNTER — Other Ambulatory Visit: Payer: Self-pay

## 2024-12-05 DIAGNOSIS — E1165 Type 2 diabetes mellitus with hyperglycemia: Secondary | ICD-10-CM

## 2024-12-05 MED ORDER — FREESTYLE LIBRE 3 PLUS SENSOR MISC
2 refills | Status: AC
  Filled 2024-12-05: qty 6, fill #0
  Filled 2024-12-08 (×2): qty 6, 90d supply, fill #0

## 2024-12-05 NOTE — Telephone Encounter (Signed)
 Done [facility]

## 2024-12-08 ENCOUNTER — Other Ambulatory Visit (HOSPITAL_COMMUNITY): Payer: Self-pay

## 2024-12-08 ENCOUNTER — Ambulatory Visit: Admitting: Podiatry

## 2024-12-08 ENCOUNTER — Telehealth (HOSPITAL_COMMUNITY): Payer: Self-pay | Admitting: Pharmacist

## 2024-12-08 ENCOUNTER — Encounter (HOSPITAL_COMMUNITY): Payer: Self-pay

## 2024-12-11 ENCOUNTER — Other Ambulatory Visit (HOSPITAL_COMMUNITY)

## 2024-12-13 ENCOUNTER — Ambulatory Visit: Admitting: Nurse Practitioner

## 2024-12-25 ENCOUNTER — Ambulatory Visit: Payer: Self-pay | Admitting: Nurse Practitioner
